# Patient Record
Sex: Female | Born: 1960 | Race: Black or African American | Hispanic: No | Marital: Single | Smoking: Current every day smoker
Health system: Southern US, Community
[De-identification: ages and names within clinical notes are randomized; demographics above are authoritative.]

## PROBLEM LIST (undated history)

## (undated) DIAGNOSIS — M549 Dorsalgia, unspecified: Secondary | ICD-10-CM

## (undated) DIAGNOSIS — G8929 Other chronic pain: Secondary | ICD-10-CM

## (undated) DIAGNOSIS — J449 Chronic obstructive pulmonary disease, unspecified: Secondary | ICD-10-CM

## (undated) DIAGNOSIS — J45909 Unspecified asthma, uncomplicated: Secondary | ICD-10-CM

## (undated) HISTORY — PX: ABDOMINAL HYSTERECTOMY: SHX81

---

## 2004-03-07 ENCOUNTER — Emergency Department: Payer: Self-pay | Admitting: Emergency Medicine

## 2005-06-18 ENCOUNTER — Emergency Department: Payer: Self-pay | Admitting: Emergency Medicine

## 2005-06-18 ENCOUNTER — Other Ambulatory Visit: Payer: Self-pay

## 2005-12-06 ENCOUNTER — Ambulatory Visit: Payer: Self-pay | Admitting: *Deleted

## 2005-12-18 ENCOUNTER — Ambulatory Visit: Payer: Self-pay | Admitting: *Deleted

## 2006-10-02 ENCOUNTER — Ambulatory Visit: Payer: Self-pay | Admitting: Podiatry

## 2006-10-17 ENCOUNTER — Ambulatory Visit: Payer: Self-pay | Admitting: *Deleted

## 2006-11-27 ENCOUNTER — Emergency Department: Payer: Self-pay | Admitting: Emergency Medicine

## 2006-12-08 ENCOUNTER — Ambulatory Visit: Payer: Self-pay | Admitting: Podiatry

## 2006-12-12 ENCOUNTER — Ambulatory Visit: Payer: Self-pay | Admitting: Podiatry

## 2007-01-06 ENCOUNTER — Emergency Department: Payer: Self-pay | Admitting: Emergency Medicine

## 2007-01-06 ENCOUNTER — Other Ambulatory Visit: Payer: Self-pay

## 2007-07-22 ENCOUNTER — Emergency Department: Payer: Self-pay | Admitting: Emergency Medicine

## 2007-07-22 ENCOUNTER — Other Ambulatory Visit: Payer: Self-pay

## 2008-01-14 ENCOUNTER — Emergency Department: Payer: Self-pay | Admitting: Emergency Medicine

## 2008-02-14 ENCOUNTER — Emergency Department: Payer: Self-pay | Admitting: Emergency Medicine

## 2009-04-18 ENCOUNTER — Ambulatory Visit: Payer: Self-pay | Admitting: Family Medicine

## 2009-05-12 ENCOUNTER — Ambulatory Visit: Payer: Self-pay | Admitting: Family Medicine

## 2009-08-28 ENCOUNTER — Emergency Department: Payer: Self-pay | Admitting: Emergency Medicine

## 2010-01-25 ENCOUNTER — Emergency Department: Payer: Self-pay | Admitting: Internal Medicine

## 2010-05-25 ENCOUNTER — Emergency Department: Payer: Self-pay | Admitting: Unknown Physician Specialty

## 2010-12-12 ENCOUNTER — Inpatient Hospital Stay: Payer: Self-pay | Admitting: Specialist

## 2011-10-30 ENCOUNTER — Emergency Department: Payer: Self-pay | Admitting: Emergency Medicine

## 2011-10-30 LAB — COMPREHENSIVE METABOLIC PANEL
Albumin: 4.1 g/dL (ref 3.4–5.0)
Alkaline Phosphatase: 79 U/L (ref 50–136)
BUN: 11 mg/dL (ref 7–18)
Bilirubin,Total: 0.4 mg/dL (ref 0.2–1.0)
Chloride: 110 mmol/L — ABNORMAL HIGH (ref 98–107)
Co2: 27 mmol/L (ref 21–32)
Creatinine: 0.74 mg/dL (ref 0.60–1.30)
EGFR (African American): >60
EGFR (Non-African Amer.): >60
Glucose: 78 mg/dL (ref 65–99)
Osmolality: 281 (ref 275–301)
SGPT (ALT): 21 U/L (ref 12–78)
Sodium: 142 mmol/L (ref 136–145)

## 2011-10-30 LAB — CBC WITH DIFFERENTIAL/PLATELET
Basophil %: 1.4 %
Eosinophil #: 0.1 10*3/uL (ref 0.0–0.7)
Eosinophil %: 0.8 %
HGB: 16.2 g/dL — ABNORMAL HIGH (ref 12.0–16.0)
Lymphocyte #: 3.1 10*3/uL (ref 1.0–3.6)
Lymphocyte %: 35.6 %
MCH: 30.8 pg (ref 26.0–34.0)
Monocyte #: 0.7 x10 3/mm (ref 0.2–0.9)
Neutrophil %: 54.2 %
Platelet: 204 10*3/uL (ref 150–440)
RBC: 5.26 10*6/uL — ABNORMAL HIGH (ref 3.80–5.20)
RDW: 14.8 % — ABNORMAL HIGH (ref 11.5–14.5)

## 2011-10-30 LAB — URINALYSIS, COMPLETE
Bilirubin,UR: NEGATIVE
Protein: 30
RBC,UR: 13 /HPF (ref 0–5)
Squamous Epithelial: 39
WBC UR: 25 /HPF (ref 0–5)

## 2012-03-24 ENCOUNTER — Emergency Department: Payer: Self-pay | Admitting: Emergency Medicine

## 2012-03-24 LAB — URINALYSIS, COMPLETE
Bilirubin,UR: NEGATIVE
Nitrite: NEGATIVE
Ph: 6 (ref 4.5–8.0)
RBC,UR: 3 /HPF (ref 0–5)
Specific Gravity: 1.014 (ref 1.003–1.030)
Squamous Epithelial: 12

## 2012-09-08 ENCOUNTER — Emergency Department: Payer: Self-pay | Admitting: Emergency Medicine

## 2012-09-08 LAB — URINALYSIS, COMPLETE
Bacteria: NONE SEEN
Blood: NEGATIVE
Glucose,UR: NEGATIVE mg/dL (ref 0–75)
Ketone: NEGATIVE
Nitrite: NEGATIVE
Ph: 7 (ref 4.5–8.0)
RBC,UR: 4 /HPF (ref 0–5)
Specific Gravity: 1.015 (ref 1.003–1.030)
Squamous Epithelial: 10
WBC UR: 17 /HPF (ref 0–5)

## 2012-09-08 LAB — COMPREHENSIVE METABOLIC PANEL
Bilirubin,Total: 0.4 mg/dL (ref 0.2–1.0)
Calcium, Total: 9.2 mg/dL (ref 8.5–10.1)
Creatinine: 0.74 mg/dL (ref 0.60–1.30)
Glucose: 102 mg/dL — ABNORMAL HIGH (ref 65–99)
Potassium: 3.8 mmol/L (ref 3.5–5.1)
SGOT(AST): 18 U/L (ref 15–37)
SGPT (ALT): 20 U/L (ref 12–78)
Total Protein: 7.4 g/dL (ref 6.4–8.2)

## 2012-09-08 LAB — CBC
HGB: 15.8 g/dL (ref 12.0–16.0)
MCV: 92 fL (ref 80–100)
WBC: 10.2 10*3/uL (ref 3.6–11.0)

## 2012-09-08 LAB — LIPASE, BLOOD: Lipase: 225 U/L (ref 73–393)

## 2012-09-08 LAB — GC/CHLAMYDIA PROBE AMP

## 2013-04-27 ENCOUNTER — Emergency Department: Payer: Self-pay | Admitting: Emergency Medicine

## 2013-04-27 LAB — COMPREHENSIVE METABOLIC PANEL
ALBUMIN: 3.9 g/dL (ref 3.4–5.0)
ALK PHOS: 79 U/L
Anion Gap: 3 — ABNORMAL LOW (ref 7–16)
BILIRUBIN TOTAL: 0.4 mg/dL (ref 0.2–1.0)
BUN: 12 mg/dL (ref 7–18)
Calcium, Total: 9.3 mg/dL (ref 8.5–10.1)
Chloride: 110 mmol/L — ABNORMAL HIGH (ref 98–107)
Co2: 27 mmol/L (ref 21–32)
Creatinine: 0.56 mg/dL — ABNORMAL LOW (ref 0.60–1.30)
EGFR (Non-African Amer.): >60
Glucose: 83 mg/dL (ref 65–99)
Osmolality: 278 (ref 275–301)
Potassium: 3.9 mmol/L (ref 3.5–5.1)
SGOT(AST): 18 U/L (ref 15–37)
SGPT (ALT): 18 U/L (ref 12–78)
SODIUM: 140 mmol/L (ref 136–145)
Total Protein: 7.8 g/dL (ref 6.4–8.2)

## 2013-04-27 LAB — CBC WITH DIFFERENTIAL/PLATELET
Basophil #: 0.1 10*3/uL (ref 0.0–0.1)
Basophil %: 1.1 %
Eosinophil #: 0.1 10*3/uL (ref 0.0–0.7)
Eosinophil %: 0.8 %
HCT: 47.8 % — ABNORMAL HIGH (ref 35.0–47.0)
HGB: 16.3 g/dL — ABNORMAL HIGH (ref 12.0–16.0)
Lymphocyte #: 2.7 10*3/uL (ref 1.0–3.6)
Lymphocyte %: 32.5 %
MCH: 31.5 pg (ref 26.0–34.0)
MCHC: 34 g/dL (ref 32.0–36.0)
MCV: 93 fL (ref 80–100)
MONO ABS: 0.4 x10 3/mm (ref 0.2–0.9)
Monocyte %: 4.8 %
Neutrophil #: 5.1 10*3/uL (ref 1.4–6.5)
Neutrophil %: 60.8 %
Platelet: 191 10*3/uL (ref 150–440)
RBC: 5.17 10*6/uL (ref 3.80–5.20)
RDW: 14.3 % (ref 11.5–14.5)
WBC: 8.3 10*3/uL (ref 3.6–11.0)

## 2013-04-27 LAB — URINALYSIS, COMPLETE
BILIRUBIN, UR: NEGATIVE
Blood: NEGATIVE
GLUCOSE, UR: NEGATIVE mg/dL (ref 0–75)
Ketone: NEGATIVE
Leukocyte Esterase: NEGATIVE
NITRITE: NEGATIVE
PROTEIN: NEGATIVE
Ph: 6 (ref 4.5–8.0)
Specific Gravity: 1.014 (ref 1.003–1.030)
WBC UR: 1 /HPF (ref 0–5)

## 2013-04-27 LAB — LIPASE, BLOOD: Lipase: 235 U/L (ref 73–393)

## 2013-05-14 ENCOUNTER — Ambulatory Visit: Payer: Self-pay | Admitting: Family Medicine

## 2014-04-22 ENCOUNTER — Other Ambulatory Visit: Payer: Self-pay | Admitting: Nurse Practitioner

## 2014-04-22 DIAGNOSIS — R921 Mammographic calcification found on diagnostic imaging of breast: Secondary | ICD-10-CM

## 2014-05-01 NOTE — H&P (Signed)
PATIENT NAME:  Briana Allen, Briana Allen MR#:  [id number] DATE OF BIRTH:  January 04, 1961  DATE OF ADMISSION:  12/12/2010  PRIMARY CARE PHYSICIAN: Dr. Gavin Potters at [facility]  CHIEF COMPLAINT: Left pleuritic chest pain.   HISTORY OF PRESENT ILLNESS: A 54 year old female with history of schizophrenia. She also has history of chronic obstructive pulmonary disease, she smokes about a pack per day. She presented with two day history of pain in the left side. She is complaining of pain in the left ribs and in the back area mainly when she takes a deep breath. Also complaining of severe pain with coughing. She is complaining of shortness of breath. She is complaining of cough but denies any expectoration. Also complaining of fever and chills. In the Emergency Room she was noted to have a temperature of 101.6. She said she was feeling hot at home, she was having chills. She denies any nausea, vomiting. No dizziness or lightheadedness. She has no previous cardiac history. No abdominal pain. When she was brought in here she was on 4 liters nasal cannula, she was 95% on 4 liters nasal cannula but on room air she is actually 95%. Her initial work-up in the Emergency Room showed that she had a white count of 24,000 and the chest x-ray shows that she has a left lower lobe lingula infiltrate which explains her left pleuritic chest pain. She denies any recent sick contact in the family. She is also complaining of some chest tightness and wheezing. She got a dose of 750 mg of IV Levaquin in the Emergency Room, she got 125 mg of IV Solu-Medrol and she got DuoNebs three doses in the Emergency Room. I was asked to admit the patient because of left pleuritic chest pain secondary to left lower lobe and lingular pneumonia, leukocytosis and chronic obstructive pulmonary disease exacerbation and hypoxia. She denies any hemoptysis.  REVIEW OF SYSTEMS: She is complaining of fever, chills, also complaining of some weakness. No acute change in  vision. No headache. No dizziness. She is complaining of cough, pleuritic chest pain. She has history of chronic obstructive pulmonary disease and asthma. She denies any palpitations, syncope. Denies any nausea, vomiting, abdominal pain, gastrointestinal bleed. No dysuria. No frequency. No thyroid problems. No rash. No joint pains. No anemia. No easy bruisability. No bleeding from any site. No focal numbness or weakness. She has schizophrenia.   PAST MEDICAL HISTORY: 1. Schizophrenia. 2. Asthma. 3. Chronic obstructive pulmonary disease.   PAST SURGICAL HISTORY: She said she had hysterectomy and bilateral oophorectomy.   ALLERGIES: Ampicillin.   HOME MEDICATIONS:  1. Benztropine 1 mg twice a day.  2. Divalproex 500 mg in the morning and 1000 mg at bedtime. 3. Quetiapine 200 mg in the morning, 200 at noon and 600 mg at bedtime.  4. Quetiapine 50 mg twice a day.  5. Risperidone 8 mg in the morning and 4 mg at bedtime.  6. Trazodone 150 mg at bedtime.  7. She also uses albuterol MDI p.r.n.   SOCIAL HISTORY: She lives by herself. Her friend comes sometimes. She smokes about a pack per day. She was smoking 2 packs a day before. Occasional beer drinker. She smokes marijuana.   FAMILY HISTORY: She says her mother had throat cancer.   PHYSICAL EXAMINATION:  VITAL SIGNS: Her vitals when she presented to the Emergency Room include: Temperature 101.6, heart rate 110, respiratory rate 24, blood pressure 116/81, saturating 95% on 4 liters nasal cannula. Currently heart rate 118, blood pressure 107/83, she  is saturating 97% on 2 liters and 95% on room air.   GENERAL: This is a middle-aged [place] female, obese. She is in mild distress because of left pleuritic chest pain.   HEENT: Bilateral pupils are equal. Extraocular muscles are intact. No scleral icterus. No conjunctivitis. Oral mucosa is dry. No pallor.   NECK: No thyroid tenderness, enlargement or nodule. Neck is supple. No masses,  nontender. No adenopathy. No JVD. No carotid bruit.   CHEST: She has bilateral coarse breath sounds. She has bronchial breathing on the left side posteriorly. She has some crackles there also. Normal respiratory effort. Not using accessory muscles of respiration.   HEART: Heart sounds are regular, tachycardic. No murmur. Good peripheral pulses. No lower extremity edema.   ABDOMEN: Soft, nontender. Normal bowel sounds. No hepatomegaly. No bruit. No masses.   RECTAL: Deferred.   NEUROLOGIC: She is awake, alert, oriented to time, place, and person. Poor insight. Cranial nerves are intact. Moving all extremities against gravity.   SKIN: No rash. No lesions. Skin appears to be dry.   LABORATORY, DIAGNOSTIC AND RADIOLOGICAL DATA: White count 24.4, hemoglobin 14.1, platelet count 152,000. BMP: Sodium 136, potassium 3.3, BUN 11, creatinine 1.11. Troponin negative. BNP 837. ABG shows pH 7.48, pCO2 31, pO2 64 on room air. Chest x-ray shows that she has left lower lobe and lingular pneumonia. EKG shows sinus tachycardia at 125, normal axis. She has some T wave inversions in lateral leads.   IMPRESSION:  1. Left lower lobe and lingula pneumonia.  2. Pleuritic chest pain secondary to pneumonia.  3. Chronic obstructive pulmonary disease exacerbation. 4. Systemic inflammatory response syndrome as evidenced by leukocytosis, tachycardia and fever secondary to pneumonia. 5. Schizophrenia, stable. 6. Tobacco abuse.   PLAN: A 54 year old female, she smokes about a pack per day, also history of schizophrenia. She presents with two day history of left pleuritic chest pain, coughing, chest tightness. She has left lower lobe lingula pneumonia that explains her left pleuritic chest pain. She has symptoms of systemic inflammatory response syndrome with white count of 24,000, high-grade fever, tachycardic. Blood cultures have been sent. Will send sputum culture. Will give her IV hydration. She has also been started on  Levaquin, will continue Levaquin on her. Will also give her IV Solu-Medrol. Will give her DuoNebs, Mucinex. I advised smoking cessation. Her EKG showed some nonspecific T wave changes. Will get serial cardiac enzymes on her. Monitor her on off unit telemetry but chest pain does not look to be cardiac, it looks to be pleuritic secondary to pneumonia. Will continue her psych medications.   TIME SPENT WITH ADMISSION AND COORDINATION: 45 minutes.    ____________________________ Fredia SorrowAbhinav Annette Bertelson, MD ag:cms D: 12/12/2010 19:35:57 ET T: 12/13/2010 05:48:52 ET JOB#: [id number]  cc: Fredia SorrowAbhinav Amonte Brookover, MD, <Dictator> Letitia CaulHeidi M. Grandis, MD Fredia SorrowABHINAV Cimone Fahey MD ELECTRONICALLY SIGNED 01/12/2011 17:12

## 2014-05-11 ENCOUNTER — Inpatient Hospital Stay
Admission: RE | Admit: 2014-05-11 | Discharge: 2014-05-11 | Disposition: A | Discharge status: Home / Self Care | Payer: Self-pay | Source: Ambulatory Visit | Attending: Nurse Practitioner | Admitting: Nurse Practitioner

## 2015-05-29 ENCOUNTER — Emergency Department
Admission: EM | Admit: 2015-05-29 | Discharge: 2015-05-29 | Disposition: A | Discharge status: Home / Self Care | Payer: Medicaid Other | Attending: Emergency Medicine | Admitting: Emergency Medicine

## 2015-05-29 ENCOUNTER — Encounter: Payer: Self-pay | Admitting: Emergency Medicine

## 2015-05-29 DIAGNOSIS — M549 Dorsalgia, unspecified: Secondary | ICD-10-CM | POA: Diagnosis not present

## 2015-05-29 DIAGNOSIS — R1030 Lower abdominal pain, unspecified: Secondary | ICD-10-CM

## 2015-05-29 DIAGNOSIS — F17200 Nicotine dependence, unspecified, uncomplicated: Secondary | ICD-10-CM | POA: Insufficient documentation

## 2015-05-29 DIAGNOSIS — J45909 Unspecified asthma, uncomplicated: Secondary | ICD-10-CM | POA: Diagnosis not present

## 2015-05-29 DIAGNOSIS — Z760 Encounter for issue of repeat prescription: Secondary | ICD-10-CM | POA: Diagnosis not present

## 2015-05-29 DIAGNOSIS — J449 Chronic obstructive pulmonary disease, unspecified: Secondary | ICD-10-CM | POA: Insufficient documentation

## 2015-05-29 DIAGNOSIS — G8929 Other chronic pain: Secondary | ICD-10-CM | POA: Diagnosis not present

## 2015-05-29 HISTORY — DX: Dorsalgia, unspecified: M54.9

## 2015-05-29 HISTORY — DX: Other chronic pain: G89.29

## 2015-05-29 HISTORY — DX: Unspecified asthma, uncomplicated: J45.909

## 2015-05-29 HISTORY — DX: Chronic obstructive pulmonary disease, unspecified: J44.9

## 2015-05-29 LAB — COMPREHENSIVE METABOLIC PANEL
ALBUMIN: 4.6 g/dL (ref 3.5–5.0)
ALT: 15 U/L (ref 14–54)
AST: 16 U/L (ref 15–41)
Alkaline Phosphatase: 68 U/L (ref 38–126)
Anion gap: 4 — ABNORMAL LOW (ref 5–15)
BILIRUBIN TOTAL: 0.5 mg/dL (ref 0.3–1.2)
BUN: 15 mg/dL (ref 6–20)
CHLORIDE: 106 mmol/L (ref 101–111)
CO2: 29 mmol/L (ref 22–32)
CREATININE: 0.62 mg/dL (ref 0.44–1.00)
Calcium: 9.1 mg/dL (ref 8.9–10.3)
GFR calc Af Amer: >60 mL/min (ref >60)
GLUCOSE: 88 mg/dL (ref 65–99)
Potassium: 4.3 mmol/L (ref 3.5–5.1)
Sodium: 139 mmol/L (ref 135–145)
Total Protein: 8.1 g/dL (ref 6.5–8.1)

## 2015-05-29 LAB — URINALYSIS COMPLETE WITH MICROSCOPIC (ARMC ONLY)
BACTERIA UA: NONE SEEN
Bilirubin Urine: NEGATIVE
GLUCOSE, UA: NEGATIVE mg/dL
HGB URINE DIPSTICK: NEGATIVE
KETONES UR: NEGATIVE mg/dL
LEUKOCYTES UA: NEGATIVE
NITRITE: NEGATIVE
Protein, ur: NEGATIVE mg/dL
SPECIFIC GRAVITY, URINE: 1.016 (ref 1.005–1.030)
pH: 5 (ref 5.0–8.0)

## 2015-05-29 LAB — CBC
HEMATOCRIT: 45.2 % (ref 35.0–47.0)
Hemoglobin: 15 g/dL (ref 12.0–16.0)
MCH: 30.3 pg (ref 26.0–34.0)
MCHC: 33.1 g/dL (ref 32.0–36.0)
MCV: 91.8 fL (ref 80.0–100.0)
PLATELETS: 208 10*3/uL (ref 150–440)
RBC: 4.93 MIL/uL (ref 3.80–5.20)
RDW: 14.9 % — ABNORMAL HIGH (ref 11.5–14.5)
WBC: 7.8 10*3/uL (ref 3.6–11.0)

## 2015-05-29 MED ORDER — DIAZEPAM 5 MG PO TABS: 5.0000 mg | ORAL_TABLET | Freq: Three times a day (TID) | ORAL | Status: AC | PRN

## 2015-05-29 MED ORDER — OXYCODONE-ACETAMINOPHEN 5-325 MG PO TABS: 1.0000 | ORAL_TABLET | Freq: Four times a day (QID) | ORAL | Status: DC | PRN

## 2015-05-29 MED ORDER — DIAZEPAM 5 MG PO TABS
5.0000 mg | ORAL_TABLET | Freq: Once | ORAL | Status: AC
  Administered 2015-05-29: 5 mg via ORAL
  Filled 2015-05-29: qty 1

## 2015-05-29 MED ORDER — OXYCODONE-ACETAMINOPHEN 5-325 MG PO TABS
1.0000 | ORAL_TABLET | Freq: Once | ORAL | Status: AC
  Administered 2015-05-29: 1 via ORAL
  Filled 2015-05-29: qty 1

## 2015-05-29 NOTE — ED Notes (Signed)
Says lower abdominal pain.  Also says she is out of meds for chronic back pain.

## 2015-05-29 NOTE — Discharge Instructions (Signed)
Abdominal Pain, Adult °Many things can cause abdominal pain. Usually, abdominal pain is not caused by a disease and will improve without treatment. It can often be observed and treated at home. Your health care provider will do a physical exam and possibly order blood tests and X-rays to help determine the seriousness of your pain. However, in many cases, more time must pass before a clear cause of the pain can be found. Before that point, your health care provider may not know if you need more testing or further treatment. °HOME CARE INSTRUCTIONS °Monitor your abdominal pain for any changes. The following actions may help to alleviate any discomfort you are experiencing: °· Only take over-the-counter or prescription medicines as directed by your health care provider. °· Do not take laxatives unless directed to do so by your health care provider. °· Try a clear liquid diet (broth, tea, or water) as directed by your health care provider. Slowly move to a bland diet as tolerated. °SEEK MEDICAL CARE IF: °· You have unexplained abdominal pain. °· You have abdominal pain associated with nausea or diarrhea. °· You have pain when you urinate or have a bowel movement. °· You experience abdominal pain that wakes you in the night. °· You have abdominal pain that is worsened or improved by eating food. °· You have abdominal pain that is worsened with eating fatty foods. °· You have a fever. °SEEK IMMEDIATE MEDICAL CARE IF: °· Your pain does not go away within 2 hours. °· You keep throwing up (vomiting). °· Your pain is felt only in portions of the abdomen, such as the right side or the left lower portion of the abdomen. °· You pass bloody or black tarry stools. °MAKE SURE YOU: °· Understand these instructions. °· Will watch your condition. °· Will get help right away if you are not doing well or get worse. °  °This information is not intended to replace advice given to you by your health care provider. Make sure you discuss  any questions you have with your health care provider. °  °Document Released: 10/03/2004 Document Revised: 09/14/2014 Document Reviewed: 09/02/2012 °Elsevier Interactive Patient Education ©2016 Elsevier Inc. ° °Back Pain, Adult °Back pain is very common in adults. The cause of back pain is rarely dangerous and the pain often gets better over time. The cause of your back pain may not be known. Some common causes of back pain include: °· Strain of the muscles or ligaments supporting the spine. °· Wear and tear (degeneration) of the spinal disks. °· Arthritis. °· Direct injury to the back. °For many people, back pain may return. Since back pain is rarely dangerous, most people can learn to manage this condition on their own. °HOME CARE INSTRUCTIONS °Watch your back pain for any changes. The following actions may help to lessen any discomfort you are feeling: °· Remain active. It is stressful on your back to sit or stand in one place for long periods of time. Do not sit, drive, or stand in one place for more than 30 minutes at a time. Take short walks on even surfaces as soon as you are able. Try to increase the length of time you walk each day. °· Exercise regularly as directed by your health care provider. Exercise helps your back heal faster. It also helps avoid future injury by keeping your muscles strong and flexible. °· Do not stay in bed. Resting more than 1-2 days can delay your recovery. °· Pay attention to your body when you bend and lift.   The most comfortable positions are those that put less stress on your recovering back. Always use proper lifting techniques, including:  Bending your knees.  Keeping the load close to your body.  Avoiding twisting.  Find a comfortable position to sleep. Use a firm mattress and lie on your side with your knees slightly bent. If you lie on your back, put a pillow under your knees.  Avoid feeling anxious or stressed.Stress increases muscle tension and can worsen back  pain.It is important to recognize when you are anxious or stressed and learn ways to manage it, such as with exercise.  Take medicines only as directed by your health care provider. Over-the-counter medicines to reduce pain and inflammation are often the most helpful.Your health care provider may prescribe muscle relaxant drugs.These medicines help dull your pain so you can more quickly return to your normal activities and healthy exercise.  Apply ice to the injured area:  Put ice in a plastic bag.  Place a towel between your skin and the bag.  Leave the ice on for 20 minutes, 2-3 times a day for the first 2-3 days. After that, ice and heat may be alternated to reduce pain and spasms.  Maintain a healthy weight. Excess weight puts extra stress on your back and makes it difficult to maintain good posture. SEEK MEDICAL CARE IF:  You have pain that is not relieved with rest or medicine.  You have increasing pain going down into the legs or buttocks.  You have pain that does not improve in one week.  You have night pain.  You lose weight.  You have a fever or chills. SEEK IMMEDIATE MEDICAL CARE IF:   You develop new bowel or bladder control problems.  You have unusual weakness or numbness in your arms or legs.  You develop nausea or vomiting.  You develop abdominal pain.  You feel faint.   This information is not intended to replace advice given to you by your health care provider. Make sure you discuss any questions you have with your health care provider.   Document Released: 12/24/2004 Document Revised: 01/14/2014 Document Reviewed: 04/27/2013 Elsevier Interactive Patient Education 2016 Elsevier Inc.  Chronic Pain Chronic pain can be defined as pain that is off and on and lasts for 3-6 months or longer. Many things cause chronic pain, which can make it difficult to make a diagnosis. There are many treatment options available for chronic pain. However, finding a  treatment that works well for you may require trying various approaches until the right one is found. Many people benefit from a combination of two or more types of treatment to control their pain. SYMPTOMS  Chronic pain can occur anywhere in the body and can range from mild to very severe. Some types of chronic pain include:  Headache.  Low back pain.  Cancer pain.  Arthritis pain.  Neurogenic pain. This is pain resulting from damage to nerves. People with chronic pain may also have other symptoms such as:  Depression.  Anger.  Insomnia.  Anxiety. DIAGNOSIS  Your health care provider will help diagnose your condition over time. In many cases, the initial focus will be on excluding possible conditions that could be causing the pain. Depending on your symptoms, your health care provider may order tests to diagnose your condition. Some of these tests may include:   Blood tests.   CT scan.   MRI.   X-rays.   Ultrasounds.   Nerve conduction studies.  You may need  to see a specialist.  TREATMENT  Finding treatment that works well may take time. You may be referred to a pain specialist. He or she may prescribe medicine or therapies, such as:   Mindful meditation or yoga.  Shots (injections) of numbing or pain-relieving medicines into the spine or area of pain.  Local electrical stimulation.  Acupuncture.   Massage therapy.   Aroma, color, light, or sound therapy.   Biofeedback.   Working with a physical therapist to keep from getting stiff.   Regular, gentle exercise.   Cognitive or behavioral therapy.   Group support.  Sometimes, surgery may be recommended.  HOME CARE INSTRUCTIONS   Take all medicines as directed by your health care provider.   Lessen stress in your life by relaxing and doing things such as listening to calming music.   Exercise or be active as directed by your health care provider.   Eat a healthy diet and include  things such as vegetables, fruits, fish, and lean meats in your diet.   Keep all follow-up appointments with your health care provider.   Attend a support group with others suffering from chronic pain. SEEK MEDICAL CARE IF:   Your pain gets worse.   You develop a new pain that was not there before.   You cannot tolerate medicines given to you by your health care provider.   You have new symptoms since your last visit with your health care provider.  SEEK IMMEDIATE MEDICAL CARE IF:   You feel weak.   You have decreased sensation or numbness.   You lose control of bowel or bladder function.   Your pain suddenly gets much worse.   You develop shaking.  You develop chills.  You develop confusion.  You develop chest pain.  You develop shortness of breath.  MAKE SURE YOU:  Understand these instructions.  Will watch your condition.  Will get help right away if you are not doing well or get worse.   This information is not intended to replace advice given to you by your health care provider. Make sure you discuss any questions you have with your health care provider.   Document Released: 09/15/2001 Document Revised: 08/26/2012 Document Reviewed: 06/19/2012 Elsevier Interactive Patient Education [employer]

## 2015-05-29 NOTE — ED Provider Notes (Signed)
[facility] Emergency Department Provider Note  ____________________________________________  Time seen: 2:50 PM  I have reviewed the triage vital signs and the nursing notes.   HISTORY  Chief Complaint Abdominal Pain and Back Pain    HPI Briana Allen is a 55 y.o. female who complains of worsening of her chronic back pain over the last month. She's been out of her chronic OxyContin and muscle relaxer for the past month due to recently moving back to [place] from [place]. Denies any new injuries. Denies any numbness tingling or weakness in the legs. No bowel or bladder bladder incontinence or retention. She does report that she's having suprapubic pain over the last 2 weeks which is worse in the morning and hurts until she goes to the bathroom to PE. She has no trouble passing her urine. He has no urgency or frequency.Urination seems to relieve the abdominal pain Patient indicates the area of low back pain as the bilateral lumbar back.    Past Medical History  Diagnosis Date  . Chronic back pain   . COPD (chronic obstructive pulmonary disease) (HCC)   . Asthma      There are no active problems to display for this patient.    No past surgical history on file.   Current Outpatient Rx  Name  Route  Sig  Dispense  Refill  . diazepam (VALIUM) 5 MG tablet   Oral   Take 1 tablet (5 mg total) by mouth every 8 (eight) hours as needed for muscle spasms.   6 tablet   0   . oxyCODONE-acetaminophen (ROXICET) 5-325 MG tablet   Oral   Take 1 tablet by mouth every 6 (six) hours as needed for severe pain.   10 tablet   0      Allergies Ampicillin   No family history on file.  Social History Social History  Substance Use Topics  . Smoking status: Current Every Day Smoker  . Smokeless tobacco: None  . Alcohol Use: No    Review of Systems  Constitutional:   No fever or chills.  Eyes:   No vision changes.  ENT:   No sore throat. No  rhinorrhea. Cardiovascular:   No chest pain. Respiratory:   No dyspnea or cough. Gastrointestinal:   Suprapubic abdominal pain, no vomiting or diarrhea.  Genitourinary:   Negative for dysuria or difficulty urinating. Musculoskeletal:   Chronic back pain Neurological:   Negative for headaches 10-point ROS otherwise negative.  ____________________________________________   PHYSICAL EXAM:  VITAL SIGNS: ED Triage Vitals  Enc Vitals Group     BP 05/29/15 1202 126/80 mmHg     Pulse Rate 05/29/15 1202 80     Resp 05/29/15 1202 16     Temp 05/29/15 1202 98.1 F (36.7 C)     Temp Source 05/29/15 1202 Oral     SpO2 05/29/15 1202 96 %     Weight 05/29/15 1202 244 lb (110.678 kg)     Height 05/29/15 1202  (1.676 m)     Head Cir --      Peak Flow --      Pain Score 05/29/15 1203 10     Pain Loc --      Pain Edu? --      Excl. in GC? --     Vital signs reviewed, nursing assessments reviewed.   Constitutional:   Alert and oriented. Well appearing and in no distress.Morbidly obese Eyes:   No scleral icterus. No conjunctival  pallor. PERRL. EOMI.  No nystagmus. ENT   Head:   Normocephalic and atraumatic.   Nose:   No congestion/rhinnorhea. No septal hematoma   Mouth/Throat:   MMM, no pharyngeal erythema. No peritonsillar mass.    Neck:   No stridor. No SubQ emphysema. No meningismus. Hematological/Lymphatic/Immunilogical:   No cervical lymphadenopathy. Cardiovascular:   RRR. Symmetric bilateral radial and DP pulses.  No murmurs.  Respiratory:   Normal respiratory effort without tachypnea nor retractions. Breath sounds are clear and equal bilaterally. No wheezes/rales/rhonchi. Gastrointestinal:   Soft and nontender. Non distended. There is no CVA tenderness.  No rebound, rigidity, or guarding. Genitourinary:   deferred Musculoskeletal:   Nontender with normal range of motion in all extremities. No joint effusions.  No lower extremity tenderness.  No edema.No midline  spinal tenderness. No muscular tenderness in the lower back. Neurologic:   Normal speech and language.  CN 2-10 normal. Motor grossly intact. No gross focal neurologic deficits are appreciated.  Skin:    Skin is warm, dry and intact. No rash noted.  No petechiae, purpura, or bullae.  ____________________________________________    LABS (pertinent positives/negatives) (all labs ordered are listed, but only abnormal results are displayed) Labs Reviewed  URINALYSIS COMPLETEWITH MICROSCOPIC ([facility] ONLY) - Abnormal; Notable for the following:    Color, Urine YELLOW (*)    APPearance CLEAR (*)    Squamous Epithelial / LPF 0-5 (*)    All other components within normal limits  CBC - Abnormal; Notable for the following:    RDW 14.9 (*)    All other components within normal limits  COMPREHENSIVE METABOLIC PANEL - Abnormal; Notable for the following:    Anion gap 4 (*)    All other components within normal limits   ____________________________________________   EKG    ____________________________________________    RADIOLOGY    ____________________________________________   PROCEDURES   ____________________________________________   INITIAL IMPRESSION / ASSESSMENT AND PLAN / ED COURSE  Pertinent labs & imaging results that were available during my care of the patient were reviewed by me and considered in my medical decision making (see chart for details).  Patient well appearing no acute distress. Labs unremarkable, no evidence of urinary tract infection. She denies any abnormal vaginal discharge or bleeding and is low suspicion for sexually transmitted infection and denies any perineal or genital lesions. We'll discharge home to follow up with primary care. She is getting herself established with [facility]. I'll provide a limited prescription of oxycodone and Valium which should help with her muscle relaxation as well as chronic anxiety. I checked the drug database and  there were no entries in the last 6 months for this patient in [place].Considering the patient's symptoms, medical history, and physical examination today, I have low suspicion for cholecystitis or biliary pathology, pancreatitis, perforation or bowel obstruction, hernia, intra-abdominal abscess, AAA or dissection, volvulus or intussusception, mesenteric ischemia, or appendicitis.       ____________________________________________   FINAL CLINICAL IMPRESSION(S) / ED DIAGNOSES  Final diagnoses:  Chronic back pain  Suprapubic pain, unspecified laterality  Medication refill       Portions of this note were generated with dragon dictation software. Dictation errors may occur despite best attempts at proofreading.   Sharman CheekPhillip Darnell Stimson, MD 05/29/15 [phone]

## 2015-08-24 ENCOUNTER — Emergency Department
Admission: EM | Admit: 2015-08-24 | Discharge: 2015-08-24 | Disposition: A | Discharge status: Home / Self Care | Payer: Medicaid Other | Attending: Emergency Medicine | Admitting: Emergency Medicine

## 2015-08-24 ENCOUNTER — Encounter: Payer: Self-pay | Admitting: Emergency Medicine

## 2015-08-24 ENCOUNTER — Emergency Department: Payer: Medicaid Other

## 2015-08-24 DIAGNOSIS — M19011 Primary osteoarthritis, right shoulder: Secondary | ICD-10-CM | POA: Diagnosis not present

## 2015-08-24 DIAGNOSIS — J449 Chronic obstructive pulmonary disease, unspecified: Secondary | ICD-10-CM | POA: Insufficient documentation

## 2015-08-24 DIAGNOSIS — J45909 Unspecified asthma, uncomplicated: Secondary | ICD-10-CM | POA: Insufficient documentation

## 2015-08-24 DIAGNOSIS — M25511 Pain in right shoulder: Secondary | ICD-10-CM | POA: Diagnosis present

## 2015-08-24 DIAGNOSIS — F17200 Nicotine dependence, unspecified, uncomplicated: Secondary | ICD-10-CM | POA: Diagnosis not present

## 2015-08-24 MED ORDER — MELOXICAM 15 MG PO TABS
15.0000 mg | ORAL_TABLET | Freq: Every day | ORAL | 0 refills | Status: DC
Start: 2015-08-24 — End: 2017-11-23

## 2015-08-24 MED ORDER — KETOROLAC TROMETHAMINE 30 MG/ML IJ SOLN
INTRAMUSCULAR | Status: AC
  Filled 2015-08-24: qty 1

## 2015-08-24 MED ORDER — OXYCODONE-ACETAMINOPHEN 5-325 MG PO TABS
ORAL_TABLET | ORAL | Status: AC
  Administered 2015-08-24: 1 via ORAL
  Filled 2015-08-24: qty 1

## 2015-08-24 MED ORDER — OXYCODONE-ACETAMINOPHEN 5-325 MG PO TABS
1.0000 | ORAL_TABLET | Freq: Once | ORAL | Status: AC
  Administered 2015-08-24: 1 via ORAL

## 2015-08-24 NOTE — ED Triage Notes (Addendum)
Pt c/o right shoulder pain. Unable to raise right shoulder pain. Worse with movement. Pt flinches and jumps in pain when RN touches shoulder. Denies injury. Sx X 4 days. Pt denies hx other than asthma copd. Denies SHOB, nause, sweating or CP

## 2015-08-24 NOTE — ED Provider Notes (Signed)
[facility] Emergency Department Provider Note  ____________________________________________  Time seen: Approximately 5:46 PM  I have reviewed the triage vital signs and the nursing notes.   HISTORY  Chief Complaint Shoulder Pain    HPI Briana Allen is a 55 y.o. female who presents emergency department complaining of right shoulder pain. Patient states that she has not experienced an injury precipitating this pain. Patient reports chronic back pain but states that she does not typically have right shoulder pain. She denies any injury to cervical spine or her right arm. She denies any numbness or tingling and right arm. She reports a "grinding sensation" to the shoulder with movement. Pain is constant, worse with movement, sharp, severe.    Past Medical History:  Diagnosis Date  . Asthma   . Chronic back pain   . COPD (chronic obstructive pulmonary disease) (HCC)     There are no active problems to display for this patient.   History reviewed. No pertinent surgical history.  Prior to Admission medications   Medication Sig Start Date End Date Taking? Authorizing Provider  diazepam (VALIUM) 5 MG tablet Take 1 tablet (5 mg total) by mouth every 8 (eight) hours as needed for muscle spasms. 05/29/15   Sharman CheekPhillip Stafford, MD  meloxicam (MOBIC) 15 MG tablet Take 1 tablet (15 mg total) by mouth daily. 08/24/15   Delorise RoyalsJonathan D Winner Valeriano, PA-C  oxyCODONE-acetaminophen (ROXICET) 5-325 MG tablet Take 1 tablet by mouth every 6 (six) hours as needed for severe pain. 05/29/15   Sharman CheekPhillip Stafford, MD    Allergies Ampicillin  History reviewed. No pertinent family history.  Social History Social History  Substance Use Topics  . Smoking status: Current Every Day Smoker  . Smokeless tobacco: Not on file  . Alcohol use No     Review of Systems  Constitutional: No fever/chills Cardiovascular: no chest pain. Respiratory: no cough. No SOB. Musculoskeletal:  Positive for right shoulder pain. Skin: Negative for rash, abrasions, lacerations, ecchymosis. Neurological: Negative for headaches, focal weakness or numbness. 10-point ROS otherwise negative.  ____________________________________________   PHYSICAL EXAM:  VITAL SIGNS: ED Triage Vitals  Enc Vitals Group     BP 08/24/15 1708 (!) 141/83     Pulse Rate 08/24/15 1708 95     Resp 08/24/15 1708 20     Temp 08/24/15 1708 98.5 F (36.9 C)     Temp Source 08/24/15 1708 Oral     SpO2 08/24/15 1708 95 %     Weight 08/24/15 1705 248 lb (112.5 kg)     Height 08/24/15 1705 5\' 6"  (1.676 m)     Head Circumference --      Peak Flow --      Pain Score 08/24/15 1706 10     Pain Loc --      Pain Edu? --      Excl. in GC? --      Constitutional: Alert and oriented. Well appearing and in no acute distress. Eyes: Conjunctivae are normal. PERRL. EOMI. Head: Atraumatic. Neck: No stridor.  No cervical spine tenderness to palpation.  Cardiovascular: Normal rate, regular rhythm. Normal S1 and S2.  Good peripheral circulation. Respiratory: Normal respiratory effort without tachypnea or retractions. Lungs CTAB. Good air entry to the bases with no decreased or absent breath sounds. Musculoskeletal: Full range of motion to all extremities. No gross deformities appreciated.No deformities noted to right shoulder. Limited range of motion due to pain. Patient endorses tenderness to palpation over the entirety of the right  shoulder including clavicle, scapula, proximal humerus, and musculature. Patient does not report any specific point tenderness being worse than other areas of palpation. Patient would not comply with special test due to pain. Radial pulse intact. Sensation intact distally. Neurologic:  Normal speech and language. No gross focal neurologic deficits are appreciated.  Skin:  Skin is warm, dry and intact. No rash noted. Psychiatric: Mood and affect are normal. Speech and behavior are normal.  Patient exhibits appropriate insight and judgement.   ____________________________________________   LABS (all labs ordered are listed, but only abnormal results are displayed)  Labs Reviewed - No data to display ____________________________________________  EKG  EKG reveals normal sinus rhythm at a rate of 93 bpm. No ST elevations or depressions noted. Inverted P-wave in V1 is consistent with left atrial enlargement. PR, QRS, QT intervals within normal limits. No Q waves are delta waves identified. ____________________________________________  RADIOLOGY Festus BarrenI, Leonides Minder D Markese Bloxham, personally viewed and evaluated these images (plain radiographs) as part of my medical decision making, as well as reviewing the written report by the radiologist.  Dg Shoulder Right  Result Date: 08/24/2015 CLINICAL DATA:  Initial evaluation for acute shoulder pain. EXAM: RIGHT SHOULDER - 2+ VIEW COMPARISON:  None. FINDINGS: Humeral head in normal alignment with the glenoid. No acute fracture dislocation. AC joint approximated. The degenerative osteoarthritic changes present at the [facility] joint. No periarticular calcification. No acute soft tissue abnormality. Visualize right hemi thorax is clear. IMPRESSION: 1. No acute fracture or dislocation. 2. Degenerative osteoarthrosis about the right AC joint. Electronically Signed   By: Rise MuBenjamin  McClintock M.D.   On: 08/24/2015 18:55    ____________________________________________    PROCEDURES  Procedure(s) performed:    Procedures    Medications  oxyCODONE-acetaminophen (PERCOCET/ROXICET) 5-325 MG per tablet 1 tablet (1 tablet Oral Given 08/24/15 2006)     ____________________________________________   INITIAL IMPRESSION / ASSESSMENT AND PLAN / ED COURSE  Pertinent labs & imaging results that were available during my care of the patient were reviewed by me and considered in my medical decision making (see chart for details).  Clinical Course     Patient's diagnosis is consistent with Osteoarthritis of the right shoulder. X-ray reveals no acute osseous abnormality. Patient is on chronic pain medications but states [place] and has not established with a pain management specialist at this time. Patient requested pain medication but was informed that she would have to establish with a primary care or pain management to receive more narcotics.. Patient will be discharged home with prescriptions for anti-inflammatories for symptom control. Patient is to follow up with orthopedics as needed or otherwise directed. Patient is given ED precautions to return to the ED for any worsening or new symptoms.     ____________________________________________  FINAL CLINICAL IMPRESSION(S) / ED DIAGNOSES  Final diagnoses:  Primary osteoarthritis of right shoulder      NEW MEDICATIONS STARTED DURING THIS VISIT:  New Prescriptions   MELOXICAM (MOBIC) 15 MG TABLET    Take 1 tablet (15 mg total) by mouth daily.        This chart was dictated using voice recognition software/Dragon. Despite best efforts to proofread, errors can occur which can change the meaning. Any change was purely unintentional.    Racheal PatchesJonathan D Gaelan Glennon, PA-C 08/24/15 2011    Myrna Blazeravid Matthew Schaevitz, MD 08/24/15 2020

## 2016-07-16 ENCOUNTER — Encounter: Payer: Self-pay | Admitting: Emergency Medicine

## 2016-07-16 ENCOUNTER — Emergency Department
Admission: EM | Admit: 2016-07-16 | Discharge: 2016-07-16 | Disposition: A | Discharge status: Home / Self Care | Payer: Medicaid Other | Attending: Emergency Medicine | Admitting: Emergency Medicine

## 2016-07-16 ENCOUNTER — Emergency Department: Payer: Medicaid Other

## 2016-07-16 DIAGNOSIS — R079 Chest pain, unspecified: Secondary | ICD-10-CM | POA: Diagnosis present

## 2016-07-16 DIAGNOSIS — F17210 Nicotine dependence, cigarettes, uncomplicated: Secondary | ICD-10-CM | POA: Insufficient documentation

## 2016-07-16 DIAGNOSIS — J441 Chronic obstructive pulmonary disease with (acute) exacerbation: Secondary | ICD-10-CM | POA: Diagnosis not present

## 2016-07-16 DIAGNOSIS — J45909 Unspecified asthma, uncomplicated: Secondary | ICD-10-CM | POA: Diagnosis not present

## 2016-07-16 DIAGNOSIS — R109 Unspecified abdominal pain: Secondary | ICD-10-CM | POA: Diagnosis not present

## 2016-07-16 DIAGNOSIS — Z791 Long term (current) use of non-steroidal anti-inflammatories (NSAID): Secondary | ICD-10-CM | POA: Insufficient documentation

## 2016-07-16 DIAGNOSIS — G8929 Other chronic pain: Secondary | ICD-10-CM | POA: Diagnosis not present

## 2016-07-16 LAB — BASIC METABOLIC PANEL
Anion gap: 7 (ref 5–15)
BUN: 13 mg/dL (ref 6–20)
CHLORIDE: 106 mmol/L (ref 101–111)
CO2: 26 mmol/L (ref 22–32)
CREATININE: 0.67 mg/dL (ref 0.44–1.00)
Calcium: 9 mg/dL (ref 8.9–10.3)
GFR calc Af Amer: >60 mL/min (ref >60)
GFR calc non Af Amer: >60 mL/min (ref >60)
GLUCOSE: 92 mg/dL (ref 65–99)
Potassium: 3.7 mmol/L (ref 3.5–5.1)
SODIUM: 139 mmol/L (ref 135–145)

## 2016-07-16 LAB — CBC
HEMATOCRIT: 46.4 % (ref 35.0–47.0)
Hemoglobin: 15.6 g/dL (ref 12.0–16.0)
MCH: 30 pg (ref 26.0–34.0)
MCHC: 33.6 g/dL (ref 32.0–36.0)
MCV: 89.3 fL (ref 80.0–100.0)
PLATELETS: 202 10*3/uL (ref 150–440)
RBC: 5.2 MIL/uL (ref 3.80–5.20)
RDW: 15.1 % — AB (ref 11.5–14.5)
WBC: 9.2 10*3/uL (ref 3.6–11.0)

## 2016-07-16 LAB — LIPASE, BLOOD: LIPASE: 29 U/L (ref 11–51)

## 2016-07-16 LAB — HEPATIC FUNCTION PANEL
ALBUMIN: 4 g/dL (ref 3.5–5.0)
ALK PHOS: 65 U/L (ref 38–126)
ALT: 14 U/L (ref 14–54)
AST: 18 U/L (ref 15–41)
BILIRUBIN TOTAL: 0.7 mg/dL (ref 0.3–1.2)
Bilirubin, Direct: <0.1 mg/dL — ABNORMAL LOW (ref 0.1–0.5)
Total Protein: 7.2 g/dL (ref 6.5–8.1)

## 2016-07-16 LAB — TROPONIN I
Troponin I: <0.03 ng/mL (ref <0.03)
Troponin I: <0.03 ng/mL (ref <0.03)

## 2016-07-16 MED ORDER — METHYLPREDNISOLONE SODIUM SUCC 125 MG IJ SOLR
125.0000 mg | Freq: Once | INTRAMUSCULAR | Status: AC
  Administered 2016-07-16: 125 mg via INTRAVENOUS
  Filled 2016-07-16: qty 2

## 2016-07-16 MED ORDER — ONDANSETRON HCL 4 MG/2ML IJ SOLN
4.0000 mg | Freq: Once | INTRAMUSCULAR | Status: AC
  Administered 2016-07-16: 4 mg via INTRAVENOUS
  Filled 2016-07-16: qty 2

## 2016-07-16 MED ORDER — AZITHROMYCIN 250 MG PO TABS: ORAL_TABLET | ORAL | 0 refills | Status: AC

## 2016-07-16 MED ORDER — MORPHINE SULFATE (PF) 4 MG/ML IV SOLN
4.0000 mg | Freq: Once | INTRAVENOUS | Status: AC
  Administered 2016-07-16: 4 mg via INTRAVENOUS
  Filled 2016-07-16: qty 1

## 2016-07-16 MED ORDER — PREDNISONE 50 MG PO TABS: ORAL_TABLET | ORAL | 0 refills | Status: DC

## 2016-07-16 MED ORDER — IPRATROPIUM-ALBUTEROL 0.5-2.5 (3) MG/3ML IN SOLN
9.0000 mL | Freq: Once | RESPIRATORY_TRACT | Status: AC
  Administered 2016-07-16: 9 mL via RESPIRATORY_TRACT
  Filled 2016-07-16: qty 9

## 2016-07-16 NOTE — ED Triage Notes (Signed)
Pt presents to ED by EMS with c/o mid chest "heaviness" and lower back pain. Pt states her chest heaviness was present when she woke up this morning and her back pain is ongoing/chronic and she"takes medication for it". Pt reports a wet sounding cough for the past month as well. Pt states "the pollen got me. I was sick bad." pt currently has no increased work of breathing or distress noted.

## 2016-07-16 NOTE — ED Notes (Signed)
Pt given sprite. NAD

## 2016-07-16 NOTE — ED Notes (Signed)
Meal tray given to pt per edp. Pt with no acute distress noted.

## 2016-07-16 NOTE — ED Provider Notes (Signed)
[facility] Emergency Department Provider Note  ____________________________________________   First MD Initiated Contact with Patient 07/16/16 0700     (approximate)  I have reviewed the triage vital signs and the nursing notes.   HISTORY  Chief Complaint Chest Pain and Back Pain   HPI Briana Allen is a 56 y.o. female with a history of COPD as well as chronic back pain who is presenting to the emergency department today with chest pressure. She denies any pain to her chest and says that the sensation is only of pressure that is associated with coughing up yellow sputum. She says that this feels similar to an asthma exacerbation. She says that she has had worsening and ongoing difficulty with breathing over the past 2 months. She says that she is taking nebulizer treatments at home without relief. Denies any runny nose or throat pain. Does not report any sick contacts. Also whenever chronic bilateral low back pain and right hip pain. She states that this is unchanged but she needs medicine for pain control this time. Says the pain is moderate to severe and worse with movement. Denies any nausea or vomiting. Denies any radiation of the pain to the bilateral lower extremities.   Past Medical History:  Diagnosis Date  . Asthma   . Chronic back pain   . COPD (chronic obstructive pulmonary disease) (HCC)     There are no active problems to display for this patient.   Past Surgical History:  Procedure Laterality Date  . ABDOMINAL HYSTERECTOMY      Prior to Admission medications   Medication Sig Start Date End Date Taking? Authorizing Provider  diazepam (VALIUM) 5 MG tablet Take 1 tablet (5 mg total) by mouth every 8 (eight) hours as needed for muscle spasms. 05/29/15   Sharman Cheek, MD  meloxicam (MOBIC) 15 MG tablet Take 1 tablet (15 mg total) by mouth daily. 08/24/15   Cuthriell, Delorise Royals, PA-C  oxyCODONE-acetaminophen (ROXICET) 5-325 MG tablet  Take 1 tablet by mouth every 6 (six) hours as needed for severe pain. 05/29/15   Sharman Cheek, MD    Allergies Ampicillin  History reviewed. No pertinent family history.  Social History Social History  Substance Use Topics  . Smoking status: Current Every Day Smoker    Types: Cigarettes  . Smokeless tobacco: Never Used  . Alcohol use No    Review of Systems  Constitutional: No fever/chills Eyes: No visual changes. ENT: No sore throat. Cardiovascular: as above Respiratory: as above Gastrointestinal: No abdominal pain.  No nausea, no vomiting.  No diarrhea.  No constipation. Genitourinary: Negative for dysuria. Musculoskeletal: as above Skin: Negative for rash. Neurological: Negative for headaches, focal weakness or numbness.   ____________________________________________   PHYSICAL EXAM:  VITAL SIGNS: ED Triage Vitals  Enc Vitals Group     BP 07/16/16 0628 121/85     Pulse Rate 07/16/16 0628 (!) 57     Resp 07/16/16 0628 20     Temp 07/16/16 0628 98.8 F (37.1 C)     Temp Source 07/16/16 0627 Oral     SpO2 07/16/16 0628 98 %     Weight 07/16/16 0630 230 lb (104.3 kg)     Height 07/16/16 0630 5\' 6"  (1.676 m)     Head Circumference --      Peak Flow --      Pain Score 07/16/16 0627 8     Pain Loc --      Pain Edu? --  Excl. in GC? --     Constitutional: Alert and oriented. Patient rocking back and forth while sitting up. Appears in discomfort. Eyes: Conjunctivae are normal.  Head: Atraumatic. Nose: No congestion/rhinnorhea. Mouth/Throat: Mucous membranes are moist.  Neck: No stridor.   Cardiovascular: Normal rate, regular rhythm. Grossly normal heart sounds.   Respiratory: Normal respiratory effort.  No retractions. Lungs CTAB.  Mildly prolonged expiratory phase with an extra cough. Gastrointestinal: Soft with mild diffuse tenderness to palpation. No distention. No CVA tenderness. Musculoskeletal: No lower extremity tenderness nor edema.  No joint  effusions.  Mild tenderness to palpation to the low lumbar region to the midline as well as bilaterally without any deformity, step-off or ecchymosis.  Neurologic:  Normal speech and language. No gross focal neurologic deficits are appreciated. Skin:  Skin is warm, dry and intact. No rash noted. Psychiatric: Mood and affect are normal. Speech and behavior are normal.  ____________________________________________   LABS (all labs ordered are listed, but only abnormal results are displayed)  Labs Reviewed  CBC - Abnormal; Notable for the following:       Result Value   RDW 15.1 (*)    All other components within normal limits  BASIC METABOLIC PANEL  TROPONIN I  TROPONIN I  HEPATIC FUNCTION PANEL  URINALYSIS, COMPLETE (UACMP) WITH MICROSCOPIC  LIPASE, BLOOD   ____________________________________________  EKG  ED ECG REPORT I, Arelia LongestSchaevitz,  David M, the attending physician, personally viewed and interpreted this ECG.   Date: 07/16/2016  EKG Time: 0630  Rate: 77  Rhythm: normal sinus rhythm  Axis: normal  Intervals:none  ST&T Change: No ST segment elevation or depression. No abnormal T-wave inversion.  ____________________________________________  RADIOLOGY  No edema or consolidation seen on the chest x-ray. ____________________________________________   PROCEDURES  Procedure(s) performed:   Procedures  Critical Care performed:   ____________________________________________   INITIAL IMPRESSION / ASSESSMENT AND PLAN / ED COURSE  Pertinent labs & imaging results that were available during my care of the patient were reviewed by me and considered in my medical decision making (see chart for details).  ----------------------------------------- 10:51 AM on 07/16/2016 -----------------------------------------  Patient is now pain-free. Reports marked improvement in her shortness of breath and has been a relating abdominal hallway without assistance or without  any dyspnea. Abdomen is still mildly tender to palpation but very reassuring lab work. Patient able tolerate by mouth. She did not give urine and has all ready called a ride to pick her up and does not wish to stay for any further testing. She'll be discharged with treatment for COPD exacerbation. Other symptoms are likely chronic that is her back and hip pain. She is understanding the plan one to comply. Will be discharged home.      ____________________________________________   FINAL CLINICAL IMPRESSION(S) / ED DIAGNOSES  COPD exacerbation. Chronic pain. Abdominal pain.    NEW MEDICATIONS STARTED DURING THIS VISIT:  New Prescriptions   No medications on file     Note:  This document was prepared using Dragon voice recognition software and may include unintentional dictation errors.     Myrna BlazerSchaevitz, David Matthew, MD 07/16/16 1052

## 2016-07-16 NOTE — ED Notes (Signed)
Pt hitting call bell numerous times.  Explained have a very sick pt but have let dr Pershing Proudschaevitz know that she would like food.

## 2016-07-16 NOTE — ED Notes (Signed)
Ambulatory to bathroom.

## 2017-02-26 ENCOUNTER — Emergency Department: Payer: Medicaid Other

## 2017-02-26 ENCOUNTER — Emergency Department
Admission: EM | Admit: 2017-02-26 | Discharge: 2017-02-27 | Disposition: A | Discharge status: Home / Self Care | Payer: Medicaid Other | Attending: Emergency Medicine | Admitting: Emergency Medicine

## 2017-02-26 ENCOUNTER — Other Ambulatory Visit: Payer: Self-pay

## 2017-02-26 DIAGNOSIS — F17210 Nicotine dependence, cigarettes, uncomplicated: Secondary | ICD-10-CM | POA: Insufficient documentation

## 2017-02-26 DIAGNOSIS — J069 Acute upper respiratory infection, unspecified: Secondary | ICD-10-CM | POA: Diagnosis not present

## 2017-02-26 DIAGNOSIS — J441 Chronic obstructive pulmonary disease with (acute) exacerbation: Secondary | ICD-10-CM | POA: Diagnosis not present

## 2017-02-26 DIAGNOSIS — Z79899 Other long term (current) drug therapy: Secondary | ICD-10-CM | POA: Insufficient documentation

## 2017-02-26 DIAGNOSIS — J45909 Unspecified asthma, uncomplicated: Secondary | ICD-10-CM | POA: Diagnosis not present

## 2017-02-26 DIAGNOSIS — R079 Chest pain, unspecified: Secondary | ICD-10-CM | POA: Diagnosis not present

## 2017-02-26 DIAGNOSIS — R05 Cough: Secondary | ICD-10-CM | POA: Diagnosis present

## 2017-02-26 LAB — CBC WITH DIFFERENTIAL/PLATELET
BASOS ABS: 0.1 10*3/uL (ref 0–0.1)
BASOS PCT: 1 %
EOS PCT: 0 %
Eosinophils Absolute: 0 10*3/uL (ref 0–0.7)
HEMATOCRIT: 49.1 % — AB (ref 35.0–47.0)
Hemoglobin: 16.5 g/dL — ABNORMAL HIGH (ref 12.0–16.0)
LYMPHS ABS: 2.8 10*3/uL (ref 1.0–3.6)
LYMPHS PCT: 54 %
MCH: 30.4 pg (ref 26.0–34.0)
MCHC: 33.5 g/dL (ref 32.0–36.0)
MCV: 90.8 fL (ref 80.0–100.0)
MONO ABS: 0.7 10*3/uL (ref 0.2–0.9)
Monocytes Relative: 13 %
NEUTROS ABS: 1.7 10*3/uL (ref 1.4–6.5)
NEUTROS PCT: 32 %
PLATELETS: 205 10*3/uL (ref 150–440)
RBC: 5.41 MIL/uL — ABNORMAL HIGH (ref 3.80–5.20)
RDW: 14.3 % (ref 11.5–14.5)
WBC: 5.3 10*3/uL (ref 3.6–11.0)

## 2017-02-26 LAB — COMPREHENSIVE METABOLIC PANEL
ALBUMIN: 4.5 g/dL (ref 3.5–5.0)
ALT: 47 U/L (ref 14–54)
ANION GAP: 12 (ref 5–15)
AST: 65 U/L — AB (ref 15–41)
Alkaline Phosphatase: 53 U/L (ref 38–126)
BILIRUBIN TOTAL: 1 mg/dL (ref 0.3–1.2)
BUN: 14 mg/dL (ref 6–20)
CHLORIDE: 102 mmol/L (ref 101–111)
CO2: 25 mmol/L (ref 22–32)
Calcium: 8.8 mg/dL — ABNORMAL LOW (ref 8.9–10.3)
Creatinine, Ser: 0.61 mg/dL (ref 0.44–1.00)
GFR calc Af Amer: >60 mL/min (ref >60)
Glucose, Bld: 86 mg/dL (ref 65–99)
Potassium: 3.2 mmol/L — ABNORMAL LOW (ref 3.5–5.1)
Sodium: 139 mmol/L (ref 135–145)
TOTAL PROTEIN: 8.3 g/dL — AB (ref 6.5–8.1)

## 2017-02-26 LAB — LIPASE, BLOOD: Lipase: 40 U/L (ref 11–51)

## 2017-02-26 LAB — TROPONIN I

## 2017-02-26 LAB — INFLUENZA PANEL BY PCR (TYPE A & B)
INFLAPCR: NEGATIVE
INFLBPCR: NEGATIVE

## 2017-02-26 MED ORDER — ALBUTEROL SULFATE (2.5 MG/3ML) 0.083% IN NEBU
INHALATION_SOLUTION | RESPIRATORY_TRACT | Status: AC
  Filled 2017-02-26: qty 6

## 2017-02-26 MED ORDER — IPRATROPIUM-ALBUTEROL 0.5-2.5 (3) MG/3ML IN SOLN
3.0000 mL | Freq: Once | RESPIRATORY_TRACT | Status: AC
  Administered 2017-02-26: 3 mL via RESPIRATORY_TRACT
  Filled 2017-02-26: qty 3

## 2017-02-26 MED ORDER — METHYLPREDNISOLONE SODIUM SUCC 125 MG IJ SOLR
125.0000 mg | Freq: Once | INTRAMUSCULAR | Status: AC
Start: 2017-02-26 — End: 2017-02-26
  Administered 2017-02-26: 125 mg via INTRAVENOUS
  Filled 2017-02-26: qty 2

## 2017-02-26 MED ORDER — PREDNISONE 50 MG PO TABS: ORAL_TABLET | ORAL | 0 refills | Status: DC

## 2017-02-26 MED ORDER — IPRATROPIUM-ALBUTEROL 0.5-2.5 (3) MG/3ML IN SOLN
9.0000 mL | Freq: Once | RESPIRATORY_TRACT | Status: AC
  Administered 2017-02-26: 9 mL via RESPIRATORY_TRACT
  Filled 2017-02-26: qty 9

## 2017-02-26 MED ORDER — AZITHROMYCIN 250 MG PO TABS: ORAL_TABLET | ORAL | 0 refills | Status: AC

## 2017-02-26 MED ORDER — ONDANSETRON HCL 4 MG/2ML IJ SOLN
4.0000 mg | Freq: Once | INTRAMUSCULAR | Status: AC
  Administered 2017-02-26: 4 mg via INTRAVENOUS
  Filled 2017-02-26: qty 2

## 2017-02-26 MED ORDER — MORPHINE SULFATE (PF) 4 MG/ML IV SOLN
4.0000 mg | Freq: Once | INTRAVENOUS | Status: AC
  Administered 2017-02-26: 4 mg via INTRAVENOUS
  Filled 2017-02-26: qty 1

## 2017-02-26 MED ORDER — AZITHROMYCIN 500 MG PO TABS
500.0000 mg | ORAL_TABLET | Freq: Once | ORAL | Status: AC
  Administered 2017-02-26: 500 mg via ORAL
  Filled 2017-02-26: qty 1

## 2017-02-26 NOTE — ED Notes (Signed)
Patient transported to X-ray - will restart neb when she returns

## 2017-02-26 NOTE — ED Notes (Signed)
Pt unable to give urine sample at this time - pt given Sprite and to call when she has to void

## 2017-02-26 NOTE — ED Triage Notes (Signed)
Pt arrived via ems for c/o chest pain with productive cough and congestion - pt c/o flu symptoms and N/V/D for the last 4 days

## 2017-02-26 NOTE — ED Provider Notes (Signed)
[facility] Emergency Department Provider Note  ___________________________________________   None    (approximate)  I have reviewed the triage vital signs and the nursing notes.   HISTORY  Chief Complaint Chest Pain and Cough   HPI Briana Allen is a 57 y.o. female with a history of COPD as well as chronic back pain who is presenting with 4 days of cough, loss of her voice as well as diffuse abdominal pain, central chest pain and vomiting.  Per EMS, she had been having episodes of posttussive vomitus and also coughing of yellow sputum.  Patient thinks that she may have caught this illness from a young child.  Patient denies fever.  Says that she is having a tightness across the front of her chest which becomes sharp when she coughs.  Does not report diarrhea.  Patient says that she took "3 steroid" pills prior to arrival.  On referencing her home meds it looks like this was likely a total of 15 mg of prednisolone.  She says that she took these pills about an hour ago.  Past Medical History:  Diagnosis Date  . Asthma   . Chronic back pain   . COPD (chronic obstructive pulmonary disease) (HCC)     There are no active problems to display for this patient.   Past Surgical History:  Procedure Laterality Date  . ABDOMINAL HYSTERECTOMY      Prior to Admission medications   Medication Sig Start Date End Date Taking? Authorizing Provider  albuterol (PROVENTIL HFA;VENTOLIN HFA) 108 (90 Base) MCG/ACT inhaler Inhale into the lungs every 6 (six) hours as needed for wheezing or shortness of breath.    [provider]  baclofen (LIORESAL) 10 MG tablet Take 10 mg by mouth 3 (three) times daily as needed for muscle spasms.    [provider]  diazepam (VALIUM) 5 MG tablet Take 1 tablet (5 mg total) by mouth every 8 (eight) hours as needed for muscle spasms. 05/29/15   Sharman Cheek, MD  diphenhydrAMINE (BENADRYL) 25 mg capsule Take 25 mg by  mouth every 6 (six) hours as needed.    [provider]  ipratropium (ATROVENT HFA) 17 MCG/ACT inhaler Inhale 2 puffs into the lungs every 6 (six) hours.    [provider]  levocetirizine (XYZAL) 5 MG tablet Take 5 mg by mouth every evening.    [provider]  meloxicam (MOBIC) 15 MG tablet Take 1 tablet (15 mg total) by mouth daily. Patient not taking: Reported on 07/16/2016 08/24/15   Cuthriell, Delorise Royals, PA-C  omeprazole (PRILOSEC) 20 MG capsule Take 20 mg by mouth daily.    [provider]  oxyCODONE-acetaminophen (ROXICET) 5-325 MG tablet Take 1 tablet by mouth every 6 (six) hours as needed for severe pain. Patient not taking: Reported on 07/16/2016 05/29/15   Sharman Cheek, MD  prednisoLONE 5 MG TABS tablet Take 10 mg by mouth daily.    [provider]  predniSONE (DELTASONE) 50 MG tablet Take 1 tab daily x5 days 07/16/16   Myrna Blazer, MD  QUEtiapine (SEROQUEL) 400 MG tablet Take 400 mg by mouth at bedtime.    [provider]  traMADol (ULTRAM) 50 MG tablet Take by mouth every 6 (six) hours as needed.    [provider]  traZODone (DESYREL) 150 MG tablet Take by mouth at bedtime.    [provider]    Allergies Ampicillin  No family history on file.  Social History Social  History   Tobacco Use  . Smoking status: Current Every Day Smoker    Types: Cigarettes  . Smokeless tobacco: Never Used  Substance Use Topics  . Alcohol use: No  . Drug use: No    Review of Systems  Constitutional: No fever/chills Eyes: No visual changes. ENT: No sore throat. Cardiovascular: As above Respiratory: As above. Gastrointestinal:  No diarrhea.  No constipation. Genitourinary: Negative for dysuria. Musculoskeletal: Negative for back pain. Skin: Negative for rash. Neurological: Negative for headaches, focal weakness or numbness.   ____________________________________________   PHYSICAL  EXAM:  VITAL SIGNS: ED Triage Vitals  Enc Vitals Group     BP --      Pulse --      Resp --      Temp --      Temp src --      SpO2 02/26/17 2120 97 %     Weight 02/26/17 2121 200 lb (90.7 kg)     Height 02/26/17 2121 5\' 6"  (1.676 m)     Head Circumference --      Peak Flow --      Pain Score 02/26/17 2120 6     Pain Loc --      Pain Edu? --      Excl. in GC? --     Constitutional: Alert and oriented. Well appearing and in no acute distress.  Hoarse voice but controlling her secretions. Eyes: Conjunctivae are normal.  Head: Atraumatic. Nose: No congestion/rhinnorhea. Mouth/Throat: Mucous membranes are moist.  No swelling nor pus or erythema to the structures of the posterior pharynx. Neck: No stridor.   Cardiovascular: Normal rate, regular rhythm. Grossly normal heart sounds.  Chest pain is reproducible over the mid sternum. Respiratory: Tachypneic with a prolonged expiratory phase.  Able to speak in full sentences.  However, the patient has diffuse, coarse wheezing throughout all fields. Gastrointestinal: Soft but with mild and diffuse tenderness to palpation.  No distention. No CVA tenderness. Musculoskeletal: No lower extremity tenderness nor edema.  No joint effusions. Neurologic:  Normal speech and language. No gross focal neurologic deficits are appreciated. Skin:  Skin is warm, dry and intact. No rash noted. Psychiatric: Mood and affect are normal. Speech and behavior are normal.  ____________________________________________   LABS (all labs ordered are listed, but only abnormal results are displayed)  Labs Reviewed  CBC WITH DIFFERENTIAL/PLATELET - Abnormal; Notable for the following components:      Result Value   RBC 5.41 (*)    Hemoglobin 16.5 (*)    HCT 49.1 (*)    All other components within normal limits  COMPREHENSIVE METABOLIC PANEL - Abnormal; Notable for the following components:   Potassium 3.2 (*)    Calcium 8.8 (*)    Total Protein 8.3 (*)     AST 65 (*)    All other components within normal limits  TROPONIN I  LIPASE, BLOOD  INFLUENZA PANEL BY PCR (TYPE A & B)  URINALYSIS, COMPLETE (UACMP) WITH MICROSCOPIC   ____________________________________________  EKG  ED ECG REPORT I, Arelia Longestavid M Schaevitz, the attending physician, personally viewed and interpreted this ECG.   Date: 02/26/2017  EKG Time: 2129  Rate: 62  Rhythm: normal sinus rhythm  Axis: Normal  Intervals:none  ST&T Change: No ST segment elevation or depression.  No abnormal T wave inversion.  ____________________________________________  RADIOLOGY  No active cardiopulmonary disease. ____________________________________________   PROCEDURES  Procedure(s) performed:   Procedures  Critical Care performed:   ____________________________________________  INITIAL IMPRESSION / ASSESSMENT AND PLAN / ED COURSE  Pertinent labs & imaging results that were available during my care of the patient were reviewed by me and considered in my medical decision making (see chart for details).  Differential includes, but is not limited to, viral syndrome, bronchitis including COPD exacerbation, pneumonia, reactive airway disease including asthma, CHF including exacerbation with or without pulmonary/interstitial edema, pneumothorax, ACS, thoracic trauma, and pulmonary embolism. Differential diagnosis includes, but is not limited to, ACS, aortic dissection, pulmonary embolism, cardiac tamponade, pneumothorax, pneumonia, pericarditis, myocarditis, GI-related causes including esophagitis/gastritis, and musculoskeletal chest wall pain.   Differential diagnosis includes, but is not limited to, ovarian cyst, ovarian torsion, acute appendicitis, diverticulitis, urinary tract infection/pyelonephritis, endometriosis, bowel obstruction, colitis, renal colic, gastroenteritis, hernia, fibroids, endometriosis, pregnancy related pain including ectopic pregnancy, etc. As part of my  medical decision making, I reviewed the following data within the electronic MEDICAL RECORD NUMBER Notes from prior ED visits   ----------------------------------------- 11:08 PM on 02/26/2017 -----------------------------------------  Patient so far with reassuring lab work as well as chest x-ray.  Signed out to Dr. Lamont Snowball to reevaluate the patient for improvement in her respiratory status.  Reassessment will determine whether the patient will require admission to the hospital versus discharge to home.      ____________________________________________   FINAL CLINICAL IMPRESSION(S) / ED DIAGNOSES  Chest pain.  URI.  COPD exacerbation.   NEW MEDICATIONS STARTED DURING THIS VISIT:  New Prescriptions   No medications on file     Note:  This document was prepared using Dragon voice recognition software and may include unintentional dictation errors.     Myrna Blazer, MD 02/26/17 [phone]

## 2017-02-27 MED ORDER — IBUPROFEN 600 MG PO TABS
600.0000 mg | ORAL_TABLET | Freq: Once | ORAL | Status: AC
  Administered 2017-02-27: 600 mg via ORAL
  Filled 2017-02-27: qty 1

## 2017-02-27 NOTE — ED Provider Notes (Signed)
Feels improved.  Lungs more clear.  Stable for discharge.  Saturating well on room air.   Merrily Brittleifenbark, Farra Nikolic, MD 02/27/17 [phone]

## 2017-02-27 NOTE — ED Notes (Signed)
Pt taken to lobby via a wheel chair and [facility] has been called. First nurse aware. Pt has phone, keys, cloths and hat. NAD at this time.

## 2017-11-23 ENCOUNTER — Emergency Department (HOSPITAL_COMMUNITY): Payer: Medicaid Other

## 2017-11-23 ENCOUNTER — Other Ambulatory Visit: Payer: Self-pay

## 2017-11-23 ENCOUNTER — Emergency Department (HOSPITAL_COMMUNITY): Payer: Medicaid Other | Admitting: Anesthesiology

## 2017-11-23 ENCOUNTER — Ambulatory Visit (HOSPITAL_COMMUNITY)
Admission: EM | Admit: 2017-11-23 | Discharge: 2017-11-23 | Disposition: A | Discharge status: Home / Self Care | Payer: Medicaid Other | Attending: Emergency Medicine | Admitting: Emergency Medicine

## 2017-11-23 ENCOUNTER — Encounter (HOSPITAL_COMMUNITY): Payer: Self-pay | Admitting: Emergency Medicine

## 2017-11-23 ENCOUNTER — Encounter (HOSPITAL_COMMUNITY)
Admission: EM | Disposition: A | Discharge status: Home / Self Care | Payer: Self-pay | Source: Home / Self Care | Attending: Emergency Medicine

## 2017-11-23 DIAGNOSIS — J449 Chronic obstructive pulmonary disease, unspecified: Secondary | ICD-10-CM | POA: Insufficient documentation

## 2017-11-23 DIAGNOSIS — Z7952 Long term (current) use of systemic steroids: Secondary | ICD-10-CM | POA: Diagnosis not present

## 2017-11-23 DIAGNOSIS — S42252A Displaced fracture of greater tuberosity of left humerus, initial encounter for closed fracture: Secondary | ICD-10-CM | POA: Diagnosis not present

## 2017-11-23 DIAGNOSIS — F17210 Nicotine dependence, cigarettes, uncomplicated: Secondary | ICD-10-CM | POA: Insufficient documentation

## 2017-11-23 DIAGNOSIS — S43005A Unspecified dislocation of left shoulder joint, initial encounter: Secondary | ICD-10-CM

## 2017-11-23 DIAGNOSIS — W1830XA Fall on same level, unspecified, initial encounter: Secondary | ICD-10-CM | POA: Diagnosis not present

## 2017-11-23 DIAGNOSIS — Z79899 Other long term (current) drug therapy: Secondary | ICD-10-CM | POA: Diagnosis not present

## 2017-11-23 DIAGNOSIS — S43006A Unspecified dislocation of unspecified shoulder joint, initial encounter: Secondary | ICD-10-CM

## 2017-11-23 DIAGNOSIS — T148XXA Other injury of unspecified body region, initial encounter: Secondary | ICD-10-CM

## 2017-11-23 DIAGNOSIS — M21922 Unspecified acquired deformity of left upper arm: Secondary | ICD-10-CM | POA: Diagnosis present

## 2017-11-23 DIAGNOSIS — S42292A Other displaced fracture of upper end of left humerus, initial encounter for closed fracture: Secondary | ICD-10-CM

## 2017-11-23 HISTORY — PX: SHOULDER CLOSED REDUCTION: SHX1051

## 2017-11-23 LAB — CBC WITH DIFFERENTIAL/PLATELET
ABS IMMATURE GRANULOCYTES: 0.07 10*3/uL (ref 0.00–0.07)
BASOS ABS: 0.1 10*3/uL (ref 0.0–0.1)
Basophils Relative: 1 %
Eosinophils Absolute: 0 10*3/uL (ref 0.0–0.5)
Eosinophils Relative: 0 %
HEMATOCRIT: 44.6 % (ref 36.0–46.0)
HEMOGLOBIN: 13.8 g/dL (ref 12.0–15.0)
Immature Granulocytes: 1 %
LYMPHS ABS: 2.4 10*3/uL (ref 0.7–4.0)
Lymphocytes Relative: 17 %
MCH: 29.8 pg (ref 26.0–34.0)
MCHC: 30.9 g/dL (ref 30.0–36.0)
MCV: 96.3 fL (ref 80.0–100.0)
Monocytes Absolute: 0.8 10*3/uL (ref 0.1–1.0)
Monocytes Relative: 6 %
NRBC: 0 % (ref 0.0–0.2)
Neutro Abs: 10.8 10*3/uL — ABNORMAL HIGH (ref 1.7–7.7)
Neutrophils Relative %: 75 %
Platelets: 200 10*3/uL (ref 150–400)
RBC: 4.63 MIL/uL (ref 3.87–5.11)
RDW: 13.8 % (ref 11.5–15.5)
WBC: 14.1 10*3/uL — ABNORMAL HIGH (ref 4.0–10.5)

## 2017-11-23 LAB — COMPREHENSIVE METABOLIC PANEL
ALBUMIN: 3.9 g/dL (ref 3.5–5.0)
ALT: 19 U/L (ref 0–44)
AST: 23 U/L (ref 15–41)
Alkaline Phosphatase: 59 U/L (ref 38–126)
Anion gap: 8 (ref 5–15)
BILIRUBIN TOTAL: 0.4 mg/dL (ref 0.3–1.2)
BUN: 12 mg/dL (ref 6–20)
CALCIUM: 8.6 mg/dL — AB (ref 8.9–10.3)
CO2: 23 mmol/L (ref 22–32)
Chloride: 108 mmol/L (ref 98–111)
Creatinine, Ser: 0.75 mg/dL (ref 0.44–1.00)
GFR calc Af Amer: >60 mL/min (ref >60)
GFR calc non Af Amer: >60 mL/min (ref >60)
GLUCOSE: 129 mg/dL — AB (ref 70–99)
POTASSIUM: 3.6 mmol/L (ref 3.5–5.1)
Sodium: 139 mmol/L (ref 135–145)
TOTAL PROTEIN: 6.2 g/dL — AB (ref 6.5–8.1)

## 2017-11-23 SURGERY — CLOSED REDUCTION, SHOULDER
Anesthesia: General | Site: Arm Upper | Laterality: Left

## 2017-11-23 MED ORDER — PROPOFOL 10 MG/ML IV BOLUS
INTRAVENOUS | Status: AC
  Filled 2017-11-23: qty 20

## 2017-11-23 MED ORDER — LACTATED RINGERS IV SOLN
INTRAVENOUS | Status: DC | PRN
  Administered 2017-11-23: 08:00:00 via INTRAVENOUS

## 2017-11-23 MED ORDER — FENTANYL CITRATE (PF) 250 MCG/5ML IJ SOLN
INTRAMUSCULAR | Status: AC
  Filled 2017-11-23: qty 5

## 2017-11-23 MED ORDER — OXYCODONE HCL 5 MG PO TABS
5.0000 mg | ORAL_TABLET | Freq: Once | ORAL | Status: AC | PRN
  Administered 2017-11-23: 5 mg via ORAL

## 2017-11-23 MED ORDER — MEPERIDINE HCL 50 MG/ML IJ SOLN: 6.2500 mg | INTRAMUSCULAR | Status: DC | PRN

## 2017-11-23 MED ORDER — OXYCODONE HCL 5 MG/5ML PO SOLN: 5.0000 mg | Freq: Once | ORAL | Status: AC | PRN

## 2017-11-23 MED ORDER — KETOROLAC TROMETHAMINE 15 MG/ML IJ SOLN
INTRAMUSCULAR | Status: AC
  Filled 2017-11-23: qty 1

## 2017-11-23 MED ORDER — ETOMIDATE 2 MG/ML IV SOLN
INTRAVENOUS | Status: AC
  Filled 2017-11-23: qty 10

## 2017-11-23 MED ORDER — MIDAZOLAM HCL 5 MG/5ML IJ SOLN
INTRAMUSCULAR | Status: DC | PRN
  Administered 2017-11-23: 1 mg via INTRAVENOUS

## 2017-11-23 MED ORDER — HYDROMORPHONE HCL 1 MG/ML IJ SOLN
1.0000 mg | Freq: Once | INTRAMUSCULAR | Status: AC | PRN
  Administered 2017-11-23: 1 mg via INTRAVENOUS
  Filled 2017-11-23: qty 1

## 2017-11-23 MED ORDER — ONDANSETRON 4 MG PO TBDP: 4.0000 mg | ORAL_TABLET | Freq: Three times a day (TID) | ORAL | 0 refills | Status: AC | PRN

## 2017-11-23 MED ORDER — SUCCINYLCHOLINE CHLORIDE 20 MG/ML IJ SOLN
INTRAMUSCULAR | Status: DC | PRN
  Administered 2017-11-23: 40 mg via INTRAVENOUS

## 2017-11-23 MED ORDER — ETOMIDATE 2 MG/ML IV SOLN
INTRAVENOUS | Status: AC | PRN
  Administered 2017-11-23: 10 mg via INTRAVENOUS

## 2017-11-23 MED ORDER — LIDOCAINE 2% (20 MG/ML) 5 ML SYRINGE
INTRAMUSCULAR | Status: DC | PRN
  Administered 2017-11-23: 100 mg via INTRAVENOUS

## 2017-11-23 MED ORDER — PROPOFOL 10 MG/ML IV BOLUS
INTRAVENOUS | Status: AC | PRN
  Administered 2017-11-23 (×3): 55 mg via INTRAVENOUS

## 2017-11-23 MED ORDER — PROPOFOL 10 MG/ML IV BOLUS
0.5000 mg/kg | Freq: Once | INTRAVENOUS | Status: AC
  Administered 2017-11-23: 150 mg via INTRAVENOUS

## 2017-11-23 MED ORDER — FENTANYL CITRATE (PF) 100 MCG/2ML IJ SOLN
INTRAMUSCULAR | Status: AC
  Filled 2017-11-23: qty 2

## 2017-11-23 MED ORDER — MIDAZOLAM HCL 2 MG/2ML IJ SOLN
INTRAMUSCULAR | Status: AC
  Filled 2017-11-23: qty 2

## 2017-11-23 MED ORDER — OXYCODONE HCL 5 MG PO TABS
ORAL_TABLET | ORAL | Status: AC
  Filled 2017-11-23: qty 1

## 2017-11-23 MED ORDER — HYDROCODONE-ACETAMINOPHEN 5-325 MG PO TABS: 1.0000 | ORAL_TABLET | Freq: Three times a day (TID) | ORAL | 0 refills | Status: AC | PRN

## 2017-11-23 MED ORDER — KETOROLAC TROMETHAMINE 15 MG/ML IJ SOLN
15.0000 mg | Freq: Once | INTRAMUSCULAR | Status: AC
  Administered 2017-11-23: 15 mg via INTRAVENOUS
  Filled 2017-11-23: qty 1

## 2017-11-23 MED ORDER — FENTANYL CITRATE (PF) 100 MCG/2ML IJ SOLN
25.0000 ug | INTRAMUSCULAR | Status: DC | PRN
  Administered 2017-11-23: 25 ug via INTRAVENOUS

## 2017-11-23 MED ORDER — PROPOFOL 10 MG/ML IV BOLUS
INTRAVENOUS | Status: AC
  Filled 2017-11-23: qty 40

## 2017-11-23 MED ORDER — FENTANYL CITRATE (PF) 100 MCG/2ML IJ SOLN
INTRAMUSCULAR | Status: DC | PRN
  Administered 2017-11-23 (×2): 50 ug via INTRAVENOUS

## 2017-11-23 MED ORDER — KETOROLAC TROMETHAMINE 10 MG PO TABS: 10.0000 mg | ORAL_TABLET | Freq: Four times a day (QID) | ORAL | 0 refills | Status: AC | PRN

## 2017-11-23 MED ORDER — HYDROMORPHONE HCL 1 MG/ML IJ SOLN
1.0000 mg | Freq: Once | INTRAMUSCULAR | Status: AC
  Administered 2017-11-23: 1 mg via INTRAVENOUS
  Filled 2017-11-23: qty 1

## 2017-11-23 MED ORDER — NALOXONE HCL 4 MG/0.1ML NA LIQD: 1.0000 | Freq: Once | NASAL | 0 refills | Status: AC

## 2017-11-23 MED ORDER — PROMETHAZINE HCL 25 MG/ML IJ SOLN: 6.2500 mg | INTRAMUSCULAR | Status: DC | PRN

## 2017-11-23 SURGICAL SUPPLY — 1 items: SLING ARM FOAM STRAP XLG (SOFTGOODS) ×3 IMPLANT

## 2017-11-23 NOTE — Anesthesia Procedure Notes (Signed)
Procedure Name: General with mask airway Date/Time: 11/23/2017 7:59 AM Performed by: Gwenyth AllegraAdami, Shakedra Beam, CRNA Pre-anesthesia Checklist: Patient identified, Emergency Drugs available, Suction available, Patient being monitored and Timeout performed Patient Re-evaluated:Patient Re-evaluated prior to induction Oxygen Delivery Method: Circle system utilized Preoxygenation: Pre-oxygenation with 100% oxygen Induction Type: IV induction Ventilation: Mask ventilation without difficulty

## 2017-11-23 NOTE — Brief Op Note (Signed)
11/23/2017  8:18 AM  PATIENT:  Briana BrakemanDonna Jean Allen  57 y.o. female  PRE-OPERATIVE DIAGNOSIS:  DISLOCATED SHOULDER, LEFT  POST-OPERATIVE DIAGNOSIS:  DISLOCATED SHOULDER, LEFT  PROCEDURE:  Procedure(s): CLOSED REDUCTION SHOULDER (Left)  STRESS EVALUATION UNDER FLUOROSCOPY  SURGEON:  Surgeon(s) and Role:    Myrene Galas* Zamari Vea, MD - Primary  ASSISTANTS: none   ANESTHESIA:   general  EBL:  NONE  BLOOD ADMINISTERED:none  DRAINS: none   LOCAL MEDICATIONS USED:  NONE  SPECIMEN:  No Specimen  DISPOSITION OF SPECIMEN:  N/A  COUNTS:  YES  TOURNIQUET:  * No tourniquets in log *  DICTATION: .Other Dictation: Dictation Number [id number]  PLAN OF CARE: Discharge to home after PACU  PATIENT DISPOSITION:  PACU - hemodynamically stable.   Delay start of Pharmacological VTE agent (>24hrs) due to surgical blood loss or risk of bleeding: not applicable

## 2017-11-23 NOTE — ED Triage Notes (Signed)
[place] and landing on her left shoulder. Was helping out taking out trash. Obvious deformity.

## 2017-11-23 NOTE — Discharge Instructions (Addendum)
Orthopaedic Trauma Service Discharge Instructions   General Discharge Instructions  WEIGHT BEARING STATUS: Nonweightbearing Left upper extremity, no lifting with left arm   RANGE OF MOTION/ACTIVITY: ok to gently move left shoulder. Ok to come out of sling periodically to move elbow. Continue to move forearm, wrist and fingers. Sling on at all times except for when doing range of motion exercises   Wound Care: ok to clean left arm as needed   Diet: as you were eating previously.  Can use over the counter stool softeners and bowel preparations, such as Miralax, to help with bowel movements.  Narcotics can be constipating.  Be sure to drink plenty of fluids  PAIN MEDICATION USE AND EXPECTATIONS  You have likely been given narcotic medications to help control your pain.  After a traumatic event that results in an fracture (broken bone) with or without surgery, it is ok to use narcotic pain medications to help control one's pain.  We understand that everyone responds to pain differently and each individual patient will be evaluated on a regular basis for the continued need for narcotic medications. Ideally, narcotic medication use should last no more than 6-8 weeks (coinciding with fracture healing).   As a patient it is your responsibility as well to monitor narcotic medication use and report the amount and frequency you use these medications when you come to your office visit.   We would also advise that if you are using narcotic medications, you should take a dose prior to therapy to maximize you participation.  IF YOU ARE ON NARCOTIC MEDICATIONS IT IS NOT PERMISSIBLE TO OPERATE A MOTOR VEHICLE (MOTORCYCLE/CAR/TRUCK/MOPED) OR HEAVY MACHINERY DO NOT MIX NARCOTICS WITH OTHER CNS (CENTRAL NERVOUS SYSTEM) DEPRESSANTS SUCH AS ALCOHOL   STOP SMOKING OR USING NICOTINE PRODUCTS!!!!  As discussed nicotine severely impairs your body's ability to heal surgical and traumatic wounds but also impairs bone  healing.  Wounds and bone heal by forming microscopic blood vessels (angiogenesis) and nicotine is a vasoconstrictor (essentially, shrinks blood vessels).  Therefore, if vasoconstriction occurs to these microscopic blood vessels they essentially disappear and are unable to deliver necessary nutrients to the healing tissue.  This is one modifiable factor that you can do to dramatically increase your chances of healing your injury.    (This means no smoking, no nicotine gum, patches, etc)  DO NOT USE NONSTEROIDAL ANTI-INFLAMMATORY DRUGS (NSAID'S)  Using products such as Advil (ibuprofen), Aleve (naproxen), Motrin (ibuprofen) for additional pain control during fracture healing can delay and/or prevent the healing response.  If you would like to take over the counter (OTC) medication, Tylenol (acetaminophen) is ok.  However, some narcotic medications that are given for pain control contain acetaminophen as well. Therefore, you should not exceed more than 4000 mg of tylenol in a day if you do not have liver disease.  Also note that there are may OTC medicines, such as cold medicines and allergy medicines that my contain tylenol as well.  If you have any questions about medications and/or interactions please ask your doctor/PA or your pharmacist.      ICE AND ELEVATE INJURED/OPERATIVE EXTREMITY  Using ice and elevating the injured extremity above your heart can help with swelling and pain control.  Icing in a pulsatile fashion, such as 20 minutes on and 20 minutes off, can be followed.    Do not place ice directly on skin. Make sure there is a barrier between to skin and the ice pack.    Using frozen  items such as frozen peas works well as the conform nicely to the are that needs to be iced.  USE AN ACE WRAP OR TED HOSE FOR SWELLING CONTROL  In addition to icing and elevation, Ace wraps or TED hose are used to help limit and resolve swelling.  It is recommended to use Ace wraps or TED hose until you are  informed to stop.    When using Ace Wraps start the wrapping distally (farthest away from the body) and wrap proximally (closer to the body)   Example: If you had surgery on your leg or thing and you do not have a splint on, start the ace wrap at the toes and work your way up to the thigh        If you had surgery on your upper extremity and do not have a splint on, start the ace wrap at your fingers and work your way up to the upper arm  IF YOU ARE IN A SPLINT OR CAST DO NOT REMOVE IT FOR ANY REASON   If your splint gets wet for any reason please contact the office immediately. You may shower in your splint or cast as long as you keep it dry.  This can be done by wrapping in a cast cover or garbage back (or similar)  Do Not stick any thing down your splint or cast such as pencils, money, or hangers to try and scratch yourself with.  If you feel itchy take benadryl as prescribed on the bottle for itching  IF YOU ARE IN A CAM BOOT (BLACK BOOT)  You may remove boot periodically. Perform daily dressing changes as noted below.  Wash the liner of the boot regularly and wear a sock when wearing the boot. It is recommended that you sleep in the boot until told otherwise  CALL THE OFFICE WITH ANY QUESTIONS OR CONCERNS: [phone]

## 2017-11-23 NOTE — Transfer of Care (Signed)
Immediate Anesthesia Transfer of Care Note  Patient: Briana BrakemanDonna Jean Allen  Procedure(s) Performed: CLOSED REDUCTION SHOULDER (Left Arm Upper)  Patient Location: PACU  Anesthesia Type:General  Level of Consciousness: awake, alert  and oriented  Airway & Oxygen Therapy: Patient Spontanous Breathing and Patient connected to nasal cannula oxygen  Post-op Assessment: Report given to RN and Post -op Vital signs reviewed and stable  Post vital signs: Reviewed and stable  Last Vitals:  Vitals Value Taken Time  BP 175/86 11/23/2017  8:24 AM  Temp    Pulse 94 11/23/2017  8:30 AM  Resp 22 11/23/2017  8:30 AM  SpO2 94 % 11/23/2017  8:30 AM  Vitals shown include unvalidated device data.  Last Pain:  Vitals:   11/23/17 0400  PainSc: 10-Worst pain ever         Complications: No apparent anesthesia complications

## 2017-11-23 NOTE — ED Provider Notes (Signed)
  I was asked to assist with shoulder reduction, which was ultimately unsuccessful in the ED.  ED Course/Procedures     Reduction of dislocation Date/Time: 11/23/2017 6:27 AM Performed by: Roxy HorsemanBrowning, Ivee Poellnitz, PA-C Authorized by: Roxy HorsemanBrowning, Lennis Rader, PA-C  Unsuccessful attempt Consent: Verbal consent obtained. Written consent obtained. Risks and benefits: risks, benefits and alternatives were discussed Consent given by: patient Patient understanding: patient states understanding of the procedure being performed Patient consent: the patient's understanding of the procedure matches consent given Procedure consent: procedure consent matches procedure scheduled Relevant documents: relevant documents present and verified Test results: test results available and properly labeled Site marked: the operative site was marked Imaging studies: imaging studies available Required items: required blood products, implants, devices, and special equipment available Patient identity confirmed: verbally with patient Time out: Immediately prior to procedure a "time out" was called to verify the correct patient, procedure, equipment, support staff and site/side marked as required. Local anesthesia used: no  Anesthesia: Local anesthesia used: no  Sedation: Patient sedated: yes (See Dr. Danielle RankinMesner's sedation note)  Patient tolerance: Patient tolerated the procedure well with no immediate complications           Roxy HorsemanBrowning, Bryauna Byrum, PA-C 11/23/17 [id number]    Mesner, Barbara CowerJason, MD 11/23/17 [phone]

## 2017-11-23 NOTE — ED Notes (Addendum)
[facility] Stores[facility] Pet[facility]  [phone]---sister

## 2017-11-23 NOTE — Progress Notes (Signed)
Orthopedic Tech Progress Note Patient Details:  Briana BrakemanDonna Jean Allen [birth year] [id number]  Ortho Devices Type of Ortho Device: Shoulder immobilizer Ortho Device/Splint Interventions: Application   Post Interventions Patient Tolerated: Well Instructions Provided: Care of device, Adjustment of device   Norva KarvonenWalls, Tenaya Hilyer T 11/23/2017, 5:35 AM

## 2017-11-23 NOTE — Anesthesia Preprocedure Evaluation (Signed)
Anesthesia Evaluation  Patient identified by MRN, date of birth, ID band Patient awake    Reviewed: Allergy & Precautions, NPO status , Patient's Chart, lab work & pertinent test results  Airway Mallampati: II  TM Distance: >3 FB Neck ROM: Full    Dental  (+) Dental Advisory Given   Pulmonary asthma , COPD, Current Smoker,    Pulmonary exam normal breath sounds clear to auscultation       Cardiovascular negative cardio ROS Normal cardiovascular exam Rhythm:Regular Rate:Normal     Neuro/Psych negative neurological ROS  negative psych ROS   GI/Hepatic negative GI ROS, Neg liver ROS,   Endo/Other  negative endocrine ROS  Renal/GU negative Renal ROS     Musculoskeletal negative musculoskeletal ROS (+)   Abdominal (+) + obese,   Peds  Hematology negative hematology ROS (+)   Anesthesia Other Findings   Reproductive/Obstetrics negative OB ROS                             Anesthesia Physical Anesthesia Plan  ASA: III  Anesthesia Plan: General   Post-op Pain Management:    Induction: Intravenous  PONV Risk Score and Plan: 3 and Ondansetron, Dexamethasone, Midazolam and Treatment may vary due to age or medical condition  Airway Management Planned: LMA  Additional Equipment:   Intra-op Plan:   Post-operative Plan: Extubation in OR  Informed Consent: I have reviewed the patients History and Physical, chart, labs and discussed the procedure including the risks, benefits and alternatives for the proposed anesthesia with the patient or authorized representative who has indicated his/her understanding and acceptance.   Dental advisory given  Plan Discussed with: CRNA  Anesthesia Plan Comments:         Anesthesia Quick Evaluation

## 2017-11-23 NOTE — ED Provider Notes (Signed)
Emergency Department Provider Note   I have reviewed the triage vital signs and the nursing notes.   HISTORY  Chief Complaint Shoulder Injury   HPI Briana Allen is a 57 y.o. female who had a mechanical fall with subsequent left shoulder deformity and pain.  EMS picked up and give her to a micrograms of fentanyl which did not seem to help the pain brought here for further evaluation.  Patient is not hurting elsewhere.  She has no skin lesions.  Never had this problem before. No other associated or modifying symptoms.    Past Medical History:  Diagnosis Date  . Asthma   . Chronic back pain   . COPD (chronic obstructive pulmonary disease) (HCC)     There are no active problems to display for this patient.   Past Surgical History:  Procedure Laterality Date  . ABDOMINAL HYSTERECTOMY      Current Outpatient Rx  . Order #: [id number] Class: Historical Med  . Order #: [id number] Class: Historical Med  . Order #: [id number] Class: Print  . Order #: [id number] Class: Historical Med  . Order #: [id number] Class: Historical Med  . Order #: [id number] Class: Historical Med  . Order #: [id number] Class: Print  . Order #: [id number] Class: Historical Med  . Order #: [id number] Class: Print  . Order #: [id number] Class: Historical Med  . Order #: [id number] Class: Print  . Order #: [id number] Class: Historical Med  . Order #: [id number] Class: Historical Med  . Order #: [id number] Class: Historical Med    Allergies Ampicillin  No family history on file.  Social History Social History   Tobacco Use  . Smoking status: Current Every Day Smoker    Types: Cigarettes  . Smokeless tobacco: Never Used  Substance Use Topics  . Alcohol use: No  . Drug use: No    Review of Systems  All other systems negative except as documented in the HPI. All pertinent positives and negatives as reviewed in the HPI. ____________________________________________   PHYSICAL EXAM:  VITAL SIGNS: Vitals:     11/23/17 0512 11/23/17 0530  BP: (!) 168/146 (!) 150/126  Pulse: 80 83  Resp: (!) 23 18  SpO2: 96% 96%     Constitutional: Alert and oriented. Well appearing and in no acute distress. Eyes: Conjunctivae are normal. PERRL. EOMI. Head: Atraumatic. Nose: No congestion/rhinnorhea. Mouth/Throat: Mucous membranes are moist.  Oropharynx non-erythematous. Neck: No stridor.  No meningeal signs.   Cardiovascular: Normal rate, regular rhythm. Good peripheral circulation. Grossly normal heart sounds.   Respiratory: Normal respiratory effort.  No retractions. Lungs CTAB. Gastrointestinal: Soft and nontender. No distention.  Musculoskeletal: Left shoulder deformity with obvious discomfort.  Neurovascular intact. Neurologic:  Normal speech and language. No gross focal neurologic deficits are appreciated.  Skin:  Skin is warm, dry and intact. No rash noted.  ____________________________________________   LABS (all labs ordered are listed, but only abnormal results are displayed)  Labs Reviewed  CBC WITH DIFFERENTIAL/PLATELET - Abnormal; Notable for the following components:      Result Value   WBC 14.1 (*)    Neutro Abs 10.8 (*)    All other components within normal limits  COMPREHENSIVE METABOLIC PANEL - Abnormal; Notable for the following components:   Glucose, Bld 129 (*)    Calcium 8.6 (*)    Total Protein 6.2 (*)    All other components within normal limits   ____________________________________________  RADIOLOGY  Dg Shoulder Left  Result Date: 11/23/2017 CLINICAL DATA:  Fall EXAM: LEFT SHOULDER - 2+ VIEW COMPARISON:  None. FINDINGS: There is anterior dislocation at the left glenohumeral joint with a mildly displaced fracture of the posterolateral humeral head. Acromioclavicular joint is approximated. No clavicle fracture visualized. IMPRESSION: Anterior dislocation of the left glenohumeral joint with mildly displaced fracture of the posterolateral humeral head. Electronically  Signed   By: Deatra Robinson M.D.   On: 11/23/2017 04:32    ____________________________________________   PROCEDURES  Procedure(s) performed:   .Sedation Date/Time: 11/23/2017 6:44 AM Performed by: Marily Memos, MD Authorized by: Marily Memos, MD   Consent:    Consent obtained:  Verbal   Consent given by:  Patient   Risks discussed:  Allergic reaction, dysrhythmia, inadequate sedation, nausea, prolonged hypoxia resulting in organ damage, prolonged sedation necessitating reversal, respiratory compromise necessitating ventilatory assistance and intubation and vomiting   Alternatives discussed:  Analgesia without sedation, anxiolysis and regional anesthesia Universal protocol:    Procedure explained and questions answered to patient or proxy's satisfaction: yes     Relevant documents present and verified: yes     Test results available and properly labeled: yes     Imaging studies available: yes     Required blood products, implants, devices, and special equipment available: yes     Site/side marked: yes     Immediately prior to procedure a time out was called: yes     Patient identity confirmation method:  Verbally with patient Indications:    Procedure performed:  Dislocation reduction   Procedure necessitating sedation performed by:  Physician performing sedation Pre-sedation assessment:    Time since last food or drink:  2 hours   ASA classification: class 1 - normal, healthy patient     Neck mobility: normal     Mouth opening:  3 or more finger widths   Thyromental distance:  4 finger widths   Mallampati score:  I - soft palate, uvula, fauces, pillars visible   Pre-sedation assessments completed and reviewed: airway patency, cardiovascular function, hydration status, mental status, nausea/vomiting, pain level, respiratory function and temperature   Immediate pre-procedure details:    Reassessment: Patient reassessed immediately prior to procedure     Reviewed: vital  signs, relevant labs/tests and NPO status     Verified: bag valve mask available, emergency equipment available, intubation equipment available, IV patency confirmed, oxygen available and suction available   Procedure details (see MAR for exact dosages):    Preoxygenation:  Nasal cannula   Sedation:  Propofol and etomidate   Intra-procedure monitoring:  Blood pressure monitoring, cardiac monitor, continuous pulse oximetry, frequent LOC assessments, frequent vital sign checks and continuous capnometry   Intra-procedure events: none     Total Provider sedation time (minutes):  25 Post-procedure details:    Attendance: Constant attendance by certified staff until patient recovered     Recovery: Patient returned to pre-procedure baseline     Post-sedation assessments completed and reviewed: airway patency, cardiovascular function, hydration status, mental status, nausea/vomiting, pain level, respiratory function and temperature     Patient is stable for discharge or admission: yes     Patient tolerance:  Tolerated well, no immediate complications .Ortho Injury Treatment Date/Time: 11/23/2017 6:46 AM Performed by: Marily Memos, MD Authorized by: Marily Memos, MD   Consent:    Consent obtained:  Verbal   Consent given by:  Patient   Risks discussed:  Fracture, irreducible dislocation, recurrent dislocation and nerve damage   Alternatives discussed:  No treatmentInjury location: shoulder Location details: left shoulder Injury type: fracture-dislocation Dislocation type: anterior Fracture type: greater humeral  tuberosity Pre-procedure neurovascular assessment: neurovascularly intact Pre-procedure distal perfusion: normal Pre-procedure neurological function: normal Pre-procedure range of motion: normal  Anesthesia: Local anesthesia used: no  Patient sedated: Yes. Refer to sedation procedure documentation for details of sedation. Manipulation performed: yes Skin traction used:  no Skeletal traction used: yes Reduction successful: no Post-procedure neurovascular assessment: post-procedure neurovascularly intact Post-procedure distal perfusion: normal Post-procedure neurological function: normal Post-procedure range of motion: normal Patient tolerance: Patient tolerated the procedure well with no immediate complications Comments: Patient sedated with propofol and subsequently with etomidate with good results but not able to reduce the dislocation. PA also attempted per his procedure note and not successful.       ____________________________________________   INITIAL IMPRESSION / ASSESSMENT AND PLAN / ED COURSE  Unable to reduce dislocation. Discussed with Dr. Carola FrostHandy who will manage.      Pertinent labs & imaging results that were available during my care of the patient were reviewed by me and considered in my medical decision making (see chart for details).  ____________________________________________  FINAL CLINICAL IMPRESSION(S) / ED DIAGNOSES  Final diagnoses:  Dislocation of left shoulder joint, initial encounter  Other closed displaced fracture of proximal end of left humerus, initial encounter     MEDICATIONS GIVEN DURING THIS VISIT:  Medications  propofol (DIPRIVAN) 10 mg/mL bolus/IV push 52.2 mg (has no administration in time range)  propofol (DIPRIVAN) 10 mg/mL bolus/IV push (has no administration in time range)  etomidate (AMIDATE) 2 MG/ML injection (has no administration in time range)  HYDROmorphone (DILAUDID) injection 1 mg (1 mg Intravenous Given 11/23/17 0406)  propofol (DIPRIVAN) 10 mg/mL bolus/IV push (55 mg Intravenous Given 11/23/17 0509)  etomidate (AMIDATE) injection (10 mg Intravenous Given 11/23/17 0515)  HYDROmorphone (DILAUDID) injection 1 mg (1 mg Intravenous Given 11/23/17 0535)     NEW OUTPATIENT MEDICATIONS STARTED DURING THIS VISIT:  New Prescriptions   No medications on file    Note:  This note was  prepared with assistance of Dragon voice recognition software. Occasional wrong-word or sound-a-like substitutions may have occurred due to the inherent limitations of voice recognition software.   Marily MemosMesner, Roby Donaway, MD 11/23/17 [phone]

## 2017-11-23 NOTE — H&P (Signed)
[facility] Consultation  Reason for Consult: Left shoulder fracture dislocation Referring Physician: Merrily Pew, MD  Briana Allen is an 57 y.o. female.  HPI: Patient presented to ED several hours ago with severe left shoulder pain, deformity, and decreased motion after a fall. EDPs unable to close reduce with two separate attempts. She now c/o decreased sensation and motor function in her hand. Denies previous dislocation. X-rays demonstrate an anterior dislocation with impaction fracture of the greater tuberosity and head.  Past Medical History:  Diagnosis Date  . Asthma   . Chronic back pain   . COPD (chronic obstructive pulmonary disease) ([place])     Past Surgical History:  Procedure Laterality Date  . ABDOMINAL HYSTERECTOMY      No family history on file.  Social History:  reports that she has been smoking cigarettes. She has never used smokeless tobacco. She reports that she does not drink alcohol or use drugs.  Allergies:  Allergies  Allergen Reactions  . Ampicillin     Medications:  Prior to Admission:  Medications Prior to Admission  Medication Sig Dispense Refill Last Dose  . albuterol (PROVENTIL HFA;VENTOLIN HFA) 108 (90 Base) MCG/ACT inhaler Inhale into the lungs every 6 (six) hours as needed for wheezing or shortness of breath.   prn at prn  . baclofen (LIORESAL) 10 MG tablet Take 10 mg by mouth 3 (three) times daily as needed for muscle spasms.   07/15/2016 at 0600  . diazepam (VALIUM) 5 MG tablet Take 1 tablet (5 mg total) by mouth every 8 (eight) hours as needed for muscle spasms. 6 tablet 0 prn at prn  . diphenhydrAMINE (BENADRYL) 25 mg capsule Take 25 mg by mouth every 6 (six) hours as needed.   prn at prn  . ipratropium (ATROVENT HFA) 17 MCG/ACT inhaler Inhale 2 puffs into the lungs every 6 (six) hours.   prn at prn  . levocetirizine (XYZAL) 5 MG tablet Take 5 mg by mouth every evening.   prn at prn  . meloxicam (MOBIC) 15 MG tablet Take 1  tablet (15 mg total) by mouth daily. (Patient not taking: Reported on 07/16/2016) 30 tablet 0 Not Taking at Unknown time  . omeprazole (PRILOSEC) 20 MG capsule Take 20 mg by mouth daily.   07/15/2016 at 0600  . oxyCODONE-acetaminophen (ROXICET) 5-325 MG tablet Take 1 tablet by mouth every 6 (six) hours as needed for severe pain. (Patient not taking: Reported on 07/16/2016) 10 tablet 0 Not Taking at Unknown time  . prednisoLONE 5 MG TABS tablet Take 10 mg by mouth daily.   07/15/2016 at 0600  . predniSONE (DELTASONE) 50 MG tablet 1 tab PO daily x5 days 5 tablet 0   . QUEtiapine (SEROQUEL) 400 MG tablet Take 400 mg by mouth at bedtime.   07/15/2016 at 2000  . traMADol (ULTRAM) 50 MG tablet Take by mouth every 6 (six) hours as needed.   prn at prn  . traZODone (DESYREL) 150 MG tablet Take by mouth at bedtime.   07/15/2016 at 2000    Results for orders placed or performed during the hospital encounter of 11/23/17 (from the past 48 hour(s))  CBC with Differential     Status: Abnormal   Collection Time: 11/23/17  5:36 AM  Result Value Ref Range   WBC 14.1 (H) 4.0 - 10.5 K/uL   RBC 4.63 3.87 - 5.11 MIL/uL   Hemoglobin 13.8 12.0 - 15.0 g/dL   HCT 44.6 36.0 - 46.0 %  MCV 96.3 80.0 - 100.0 fL   MCH 29.8 26.0 - 34.0 pg   MCHC 30.9 30.0 - 36.0 g/dL   RDW 13.8 11.5 - 15.5 %   Platelets 200 150 - 400 K/uL   nRBC 0.0 0.0 - 0.2 %   Neutrophils Relative % 75 %   Neutro Abs 10.8 (H) 1.7 - 7.7 K/uL   Lymphocytes Relative 17 %   Lymphs Abs 2.4 0.7 - 4.0 K/uL   Monocytes Relative 6 %   Monocytes Absolute 0.8 0.1 - 1.0 K/uL   Eosinophils Relative 0 %   Eosinophils Absolute 0.0 0.0 - 0.5 K/uL   Basophils Relative 1 %   Basophils Absolute 0.1 0.0 - 0.1 K/uL   Immature Granulocytes 1 %   Abs Immature Granulocytes 0.07 0.00 - 0.07 K/uL    Comment: Performed at [facility] [address] [place], [place] [address]  Comprehensive metabolic panel     Status: Abnormal   Collection Time: 11/23/17  5:36 AM   Result Value Ref Range   Sodium 139 135 - 145 mmol/L   Potassium 3.6 3.5 - 5.1 mmol/L   Chloride 108 98 - 111 mmol/L   CO2 23 22 - 32 mmol/L   Glucose, Bld 129 (H) 70 - 99 mg/dL   BUN 12 6 - 20 mg/dL   Creatinine, Ser 0.75 0.44 - 1.00 mg/dL   Calcium 8.6 (L) 8.9 - 10.3 mg/dL   Total Protein 6.2 (L) 6.5 - 8.1 g/dL   Albumin 3.9 3.5 - 5.0 g/dL   AST 23 15 - 41 U/L   ALT 19 0 - 44 U/L   Alkaline Phosphatase 59 38 - 126 U/L   Total Bilirubin 0.4 0.3 - 1.2 mg/dL   GFR calc non Af Amer >60 >60 mL/min   GFR calc Af Amer >60 >60 mL/min    Comment: (NOTE) The eGFR has been calculated using the CKD EPI equation. This calculation has not been validated in all clinical situations. eGFR's persistently <60 mL/min signify possible Chronic Kidney Disease.    Anion gap 8 5 - 15    Comment: Performed at [facility] [address], [place], [place] [address]    Dg Shoulder Left  Result Date: 11/23/2017 CLINICAL DATA:  Fall EXAM: LEFT SHOULDER - 2+ VIEW COMPARISON:  None. FINDINGS: There is anterior dislocation at the left glenohumeral joint with a mildly displaced fracture of the posterolateral humeral head. Acromioclavicular joint is approximated. No clavicle fracture visualized. IMPRESSION: Anterior dislocation of the left glenohumeral joint with mildly displaced fracture of the posterolateral humeral head. Electronically Signed   By: Ulyses Jarred M.D.   On: 11/23/2017 04:32    ROS No recent fever, bleeding abnormalities, urologic dysfunction, GI problems, or weight gain.  Blood pressure (!) 150/126, pulse 83, resp. rate 18, height _0  (1.651 m), weight 104.3 kg, SpO2 96 %. Physical Exam NCAT, animated in manner LUEx   Sling in place  Sens  Ax, M intact; reports decreased in R/U  Mot   M intact; unable to elicit Ax/ R/ U  Rad 2+, Brisk CR   Assessment/Plan: Left should fracture dislocation with associated neurologic deficit  I discussed with the patient the risks and  benefits of closed reduction of her left shoulder under anesthesia, including the possible need to convert to open reduction with repair, infection, nerve injury, vessel injury, wound breakdown, arthritis, symptomatic hardware, DVT/ PE, loss of motion, malunion, nonunion, and need for further  surgery among others.  She acknowledged these risks and wished to proceed. We discussed the probability of nerve function return.  Altamese [place], MD [facility], [facility] [phone]  11/23/2017  7:25 AM

## 2017-11-23 NOTE — Progress Notes (Signed)
D/c instructions done with Lawanna KobusAngel who is the pt's sister. Explained all medication prescriptions given and what was given here at hospital. Explained to sister and pt what should and should not be taken and highlighted on d/c instructions when she could take medications next. Pt wanted to keep the prescriptions on her and not give them to sister.

## 2017-11-23 NOTE — Anesthesia Postprocedure Evaluation (Signed)
Anesthesia Post Note  Patient: Briana BrakemanDonna Jean Allen  Procedure(s) Performed: CLOSED REDUCTION SHOULDER (Left Arm Upper)     Patient location during evaluation: PACU Anesthesia Type: General Level of consciousness: sedated and patient cooperative Pain management: pain level controlled Vital Signs Assessment: post-procedure vital signs reviewed and stable Respiratory status: spontaneous breathing Cardiovascular status: stable Anesthetic complications: no    Last Vitals:  Vitals:   11/23/17 0853 11/23/17 0925  BP: (!) 134/96 (!) 116/99  Pulse: 86 86  Resp: 12 20  Temp:  36.8 C  SpO2: 94% 100%    Last Pain:  Vitals:   11/23/17 0853  PainSc: 5                  Lewie LoronJohn Coston Mandato

## 2017-11-24 ENCOUNTER — Encounter (HOSPITAL_COMMUNITY): Payer: Self-pay | Admitting: Orthopedic Surgery

## 2017-11-24 NOTE — Op Note (Signed)
NAMElouise Allen: Allen, Briana Allen MEDICAL RECORD [id number] ACCOUNT [id number] DATE OF BIRTH:1960/11/26 FACILITY: [facility] LOCATION: [facility] PHYSICIAN:Jehiel Koepp H. Kayda Allers, MD  OPERATIVE REPORT  DATE OF PROCEDURE:  11/23/2017  PREOPERATIVE DIAGNOSES:  Left shoulder fracture dislocation.  POSTOPERATIVE DIAGNOSES:  Left shoulder fracture dislocation.  PROCEDURE: 1.  Closed reduction of left shoulder dislocation. 2.  Closed treatment with manipulation of greater tuberosity fracture.  SURGEON:  Myrene GalasMichael Kennie Snedden, MD  ASSISTANT:  None.  ANESTHESIA:  General.  COMPLICATIONS:  None.  ESTIMATED BLOOD LOSS:  None.  DISPOSITION:  To PACU.  CONDITION:  Stable.  BRIEF SUMMARY AND INDICATIONS FOR PROCEDURE: The patient is a 57 year old female on several psychiatric medications who sustained a left shoulder dislocation with associated impaction fracture of the humeral head and greater tuberosity and a ground-level  fall.  Emergency Department physicians attempted closed reduction twice but were unsuccessful.  Consequently, we were consulted for further management.  I did discuss with the patient the risks and benefits of closed reduction including the potential  for conversion to open.  At the time of evaluation which was several hours after presentation, she also complained of decreased sensation and motor function in her hand involving the radial and ulnar nerve distributions specifically.  She did provide  consent to proceed.  BRIEF SUMMARY OF PROCEDURE:  The patient was taken to the operating room where general anesthesia was induced.  Her left shoulder was then reduced using counter pressure on the axilla and direct traction followed by rotation.  This restored her range of  motion.  C-arm was brought in, and a series of AP with internal and external rotation were obtained as well as an axillary.  Under fluoroscopy, I did manipulate the glenohumeral joint and did not identify any significant movement  of the fracture away from the humeral head to a position that would reduce her range or function.  Consequently, a decision was made as a result of this manipulation to continue with nonsurgical management.  The patient was placed into a sling, awakened from anesthesia and transported to PACU in  stable condition.  PROGNOSIS:  The patient will be in a sling but can begin immediate gentle motion, particularly flexion and extension.  We will continue to follow her neurologic function.  I plan to see her back in the office in 10-14 days for repeat evaluation.  We also contemplated a CT scan prior to discharge; however, her mental agitation precluded this without sedation.  Given our evaluation under fluoroscopy, we did not wish to subject the patient to further risk with sedation for the CT. She is at increased risk  of complications given what we perceive to be and reduced ability to comply with restrictions.  LN/NUANCE  D:11/23/2017 T:11/24/2017 JOB:[id number]

## 2017-12-12 ENCOUNTER — Other Ambulatory Visit: Payer: Self-pay | Admitting: Orthopedic Surgery

## 2017-12-12 DIAGNOSIS — S42255A Nondisplaced fracture of greater tuberosity of left humerus, initial encounter for closed fracture: Secondary | ICD-10-CM

## 2017-12-12 DIAGNOSIS — S43005A Unspecified dislocation of left shoulder joint, initial encounter: Secondary | ICD-10-CM

## 2017-12-22 ENCOUNTER — Ambulatory Visit
Admission: RE | Admit: 2017-12-22 | Discharge: 2017-12-22 | Disposition: A | Discharge status: Home / Self Care | Payer: Medicaid Other | Source: Ambulatory Visit | Attending: Orthopedic Surgery | Admitting: Orthopedic Surgery

## 2017-12-22 DIAGNOSIS — S43005A Unspecified dislocation of left shoulder joint, initial encounter: Secondary | ICD-10-CM | POA: Diagnosis not present

## 2017-12-22 DIAGNOSIS — S42255A Nondisplaced fracture of greater tuberosity of left humerus, initial encounter for closed fracture: Secondary | ICD-10-CM | POA: Insufficient documentation

## 2018-04-13 ENCOUNTER — Other Ambulatory Visit: Payer: Self-pay | Admitting: Orthopedic Surgery

## 2018-04-13 DIAGNOSIS — S43005D Unspecified dislocation of left shoulder joint, subsequent encounter: Secondary | ICD-10-CM

## 2018-04-13 DIAGNOSIS — M19012 Primary osteoarthritis, left shoulder: Secondary | ICD-10-CM

## 2018-04-13 DIAGNOSIS — M7502 Adhesive capsulitis of left shoulder: Secondary | ICD-10-CM

## 2018-04-13 DIAGNOSIS — S42255D Nondisplaced fracture of greater tuberosity of left humerus, subsequent encounter for fracture with routine healing: Secondary | ICD-10-CM

## 2018-05-25 ENCOUNTER — Other Ambulatory Visit: Payer: Self-pay | Admitting: Orthopedic Surgery

## 2018-05-25 DIAGNOSIS — S43005D Unspecified dislocation of left shoulder joint, subsequent encounter: Secondary | ICD-10-CM

## 2018-05-25 DIAGNOSIS — M7502 Adhesive capsulitis of left shoulder: Secondary | ICD-10-CM

## 2018-05-25 DIAGNOSIS — M19012 Primary osteoarthritis, left shoulder: Secondary | ICD-10-CM

## 2018-05-25 DIAGNOSIS — S42255D Nondisplaced fracture of greater tuberosity of left humerus, subsequent encounter for fracture with routine healing: Secondary | ICD-10-CM

## 2018-06-05 ENCOUNTER — Ambulatory Visit: Payer: Medicaid Other

## 2018-06-26 ENCOUNTER — Ambulatory Visit: Admission: RE | Admit: 2018-06-26 | Payer: Medicaid Other | Source: Ambulatory Visit

## 2019-08-16 ENCOUNTER — Ambulatory Visit: Payer: Medicaid Other | Admitting: Cardiology

## 2019-08-17 ENCOUNTER — Encounter: Payer: Self-pay | Admitting: Cardiology

## 2021-04-06 ENCOUNTER — Encounter (HOSPITAL_COMMUNITY): Payer: Self-pay

## 2021-04-06 ENCOUNTER — Emergency Department (HOSPITAL_COMMUNITY): Payer: No Typology Code available for payment source

## 2021-04-06 ENCOUNTER — Emergency Department (HOSPITAL_COMMUNITY)
Admission: EM | Admit: 2021-04-06 | Discharge: 2021-04-06 | Disposition: A | Discharge status: Home / Self Care | Payer: No Typology Code available for payment source | Attending: Emergency Medicine | Admitting: Emergency Medicine

## 2021-04-06 ENCOUNTER — Other Ambulatory Visit: Payer: Self-pay

## 2021-04-06 DIAGNOSIS — R519 Headache, unspecified: Secondary | ICD-10-CM | POA: Diagnosis not present

## 2021-04-06 DIAGNOSIS — S161XXA Strain of muscle, fascia and tendon at neck level, initial encounter: Secondary | ICD-10-CM | POA: Diagnosis not present

## 2021-04-06 DIAGNOSIS — Y92410 Unspecified street and highway as the place of occurrence of the external cause: Secondary | ICD-10-CM | POA: Diagnosis not present

## 2021-04-06 DIAGNOSIS — S8012XA Contusion of left lower leg, initial encounter: Secondary | ICD-10-CM | POA: Diagnosis not present

## 2021-04-06 DIAGNOSIS — M47892 Other spondylosis, cervical region: Secondary | ICD-10-CM | POA: Diagnosis not present

## 2021-04-06 DIAGNOSIS — S8992XA Unspecified injury of left lower leg, initial encounter: Secondary | ICD-10-CM | POA: Diagnosis present

## 2021-04-06 DIAGNOSIS — M47812 Spondylosis without myelopathy or radiculopathy, cervical region: Secondary | ICD-10-CM

## 2021-04-06 MED ORDER — CARISOPRODOL 250 MG PO TABS: 350.0000 mg | ORAL_TABLET | Freq: Three times a day (TID) | ORAL | 0 refills | Status: AC | PRN

## 2021-04-06 MED ORDER — HYDROCODONE-ACETAMINOPHEN 5-325 MG PO TABS
1.0000 | ORAL_TABLET | Freq: Once | ORAL | Status: AC
  Administered 2021-04-06: 1 via ORAL
  Filled 2021-04-06: qty 1

## 2021-04-06 NOTE — ED Notes (Signed)
Got patient on the monitor into a gown patient is resting with call bell in reach  
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
## 2021-04-06 NOTE — ED Notes (Signed)
Pt teaching provided on medications that may cause drowsiness. Pt instructed not to drive or operate heavy machinery while taking the prescribed medication. Pt verbalized understanding.  ? ?Pt provided discharge instructions and prescription information. Pt was given the opportunity to ask questions and questions were answered. Discharge signature not obtained in the setting of the COVID-19 pandemic in order to reduce high touch surfaces.  ? ?

## 2021-04-21 ENCOUNTER — Other Ambulatory Visit: Payer: Self-pay | Admitting: Chiropractor

## 2021-04-21 ENCOUNTER — Ambulatory Visit
Admission: RE | Admit: 2021-04-21 | Discharge: 2021-04-21 | Disposition: A | Discharge status: Home / Self Care | Payer: No Typology Code available for payment source | Source: Ambulatory Visit | Attending: Chiropractor | Admitting: Chiropractor

## 2021-04-21 ENCOUNTER — Inpatient Hospital Stay: Admit: 2021-04-21 | Payer: Medicaid Other

## 2021-04-21 DIAGNOSIS — S233XXA Sprain of ligaments of thoracic spine, initial encounter: Secondary | ICD-10-CM

## 2021-04-21 DIAGNOSIS — S134XXA Sprain of ligaments of cervical spine, initial encounter: Secondary | ICD-10-CM | POA: Diagnosis present

## 2021-04-21 DIAGNOSIS — S338XXA Sprain of other parts of lumbar spine and pelvis, initial encounter: Secondary | ICD-10-CM | POA: Diagnosis present

## 2023-01-19 ENCOUNTER — Emergency Department: Payer: MEDICAID

## 2023-01-19 ENCOUNTER — Emergency Department
Admission: EM | Admit: 2023-01-19 | Discharge: 2023-01-19 | Disposition: A | Discharge status: Home / Self Care | Payer: MEDICAID | Attending: Emergency Medicine | Admitting: Emergency Medicine

## 2023-01-19 ENCOUNTER — Other Ambulatory Visit: Payer: Self-pay

## 2023-01-19 ENCOUNTER — Encounter: Payer: Self-pay | Admitting: Emergency Medicine

## 2023-01-19 DIAGNOSIS — J45909 Unspecified asthma, uncomplicated: Secondary | ICD-10-CM | POA: Insufficient documentation

## 2023-01-19 DIAGNOSIS — J441 Chronic obstructive pulmonary disease with (acute) exacerbation: Secondary | ICD-10-CM | POA: Insufficient documentation

## 2023-01-19 DIAGNOSIS — R0781 Pleurodynia: Secondary | ICD-10-CM

## 2023-01-19 DIAGNOSIS — Z20822 Contact with and (suspected) exposure to covid-19: Secondary | ICD-10-CM | POA: Diagnosis not present

## 2023-01-19 DIAGNOSIS — R079 Chest pain, unspecified: Secondary | ICD-10-CM | POA: Diagnosis present

## 2023-01-19 LAB — BASIC METABOLIC PANEL
Anion gap: 10 (ref 5–15)
BUN: 14 mg/dL (ref 8–23)
CO2: 26 mmol/L (ref 22–32)
Calcium: 8.5 mg/dL — ABNORMAL LOW (ref 8.9–10.3)
Chloride: 104 mmol/L (ref 98–111)
Creatinine, Ser: 0.75 mg/dL (ref 0.44–1.00)
GFR, Estimated: >60 mL/min (ref >60)
Glucose, Bld: 103 mg/dL — ABNORMAL HIGH (ref 70–99)
Potassium: 3.2 mmol/L — ABNORMAL LOW (ref 3.5–5.1)
Sodium: 140 mmol/L (ref 135–145)

## 2023-01-19 LAB — CBC
HCT: 41.5 % (ref 36.0–46.0)
Hemoglobin: 13.8 g/dL (ref 12.0–15.0)
MCH: 30.2 pg (ref 26.0–34.0)
MCHC: 33.3 g/dL (ref 30.0–36.0)
MCV: 90.8 fL (ref 80.0–100.0)
Platelets: 253 10*3/uL (ref 150–400)
RBC: 4.57 MIL/uL (ref 3.87–5.11)
RDW: 13.5 % (ref 11.5–15.5)
WBC: 8.9 10*3/uL (ref 4.0–10.5)
nRBC: 0 % (ref 0.0–0.2)

## 2023-01-19 LAB — RESP PANEL BY RT-PCR (RSV, FLU A&B, COVID)  RVPGX2
Influenza A by PCR: NEGATIVE
Influenza B by PCR: NEGATIVE
Resp Syncytial Virus by PCR: NEGATIVE
SARS Coronavirus 2 by RT PCR: NEGATIVE

## 2023-01-19 LAB — D-DIMER, QUANTITATIVE: D-Dimer, Quant: 0.54 ug{FEU}/mL — ABNORMAL HIGH (ref 0.00–0.50)

## 2023-01-19 LAB — TROPONIN I (HIGH SENSITIVITY)
Troponin I (High Sensitivity): 8 ng/L (ref <18)
Troponin I (High Sensitivity): 6 ng/L (ref <18)

## 2023-01-19 MED ORDER — PREDNISONE 50 MG PO TABS: 50.0000 mg | ORAL_TABLET | Freq: Every day | ORAL | 0 refills | Status: AC

## 2023-01-19 MED ORDER — KETOROLAC TROMETHAMINE 30 MG/ML IJ SOLN
30.0000 mg | Freq: Once | INTRAMUSCULAR | Status: AC
  Administered 2023-01-19: 30 mg via INTRAVENOUS
  Filled 2023-01-19: qty 1

## 2023-01-19 MED ORDER — METHYLPREDNISOLONE SODIUM SUCC 125 MG IJ SOLR
125.0000 mg | Freq: Once | INTRAMUSCULAR | Status: AC
Start: 2023-01-19 — End: 2023-01-19
  Administered 2023-01-19: 125 mg via INTRAVENOUS
  Filled 2023-01-19: qty 2

## 2023-01-19 MED ORDER — POTASSIUM CHLORIDE CRYS ER 20 MEQ PO TBCR
40.0000 meq | EXTENDED_RELEASE_TABLET | Freq: Once | ORAL | Status: AC
  Administered 2023-01-19: 40 meq via ORAL
  Filled 2023-01-19: qty 2

## 2023-01-19 MED ORDER — IPRATROPIUM-ALBUTEROL 0.5-2.5 (3) MG/3ML IN SOLN
3.0000 mL | RESPIRATORY_TRACT | Status: AC
  Administered 2023-01-19: 3 mL via RESPIRATORY_TRACT
  Filled 2023-01-19: qty 3

## 2023-01-19 NOTE — ED Provider Notes (Signed)
 [facility] Provider Note    Event Date/Time   First MD Initiated Contact with Patient 01/19/23 [phone]     (approximate)   History   Chest Pain   HPI  Briana Allen is a 63 y.o. female with history of COPD, asthma who presents to the emergency department with left-sided chest pain.  Describes it as worse with deep inspiration, palpation and coughing.  She has had a productive cough but no fever.  Reports she is feeling short of breath and is having wheezing.  No history of PE, DVT, lower extremity swelling or discomfort.   History provided by patient.    Past Medical History:  Diagnosis Date   Asthma    Chronic back pain    COPD (chronic obstructive pulmonary disease) (HCC)     Past Surgical History:  Procedure Laterality Date   ABDOMINAL HYSTERECTOMY     SHOULDER CLOSED REDUCTION Left 11/23/2017   Procedure: CLOSED REDUCTION SHOULDER;  Surgeon: Celena Sharper, MD;  Location: [facility] OR;  Service: Orthopedics;  Laterality: Left;    MEDICATIONS:  Prior to Admission medications   Medication Sig Start Date End Date Taking? Authorizing Provider  albuterol  (PROVENTIL  HFA;VENTOLIN  HFA) 108 (90 Base) MCG/ACT inhaler Inhale into the lungs every 6 (six) hours as needed for wheezing or shortness of breath.    [provider]  baclofen (LIORESAL) 10 MG tablet Take 10 mg by mouth 3 (three) times daily as needed for muscle spasms.    [provider]  carisoprodol  (SOMA ) 250 MG tablet Take 1.5 tablets (375 mg total) by mouth every 8 (eight) hours as needed for muscle spasms. 04/06/21   Lorriane Holmes, MD  diazepam  (VALIUM ) 5 MG tablet Take 1 tablet (5 mg total) by mouth every 8 (eight) hours as needed for muscle spasms. 05/29/15   Viviann Pastor, MD  diphenhydrAMINE (BENADRYL) 25 mg capsule Take 25 mg by mouth every 6 (six) hours as needed.    [provider]  HYDROcodone -acetaminophen  (NORCO) 5-325 MG tablet Take 1 tablet by mouth  every 8 (eight) hours as needed for moderate pain. 11/23/17   Deward Eck, PA-C  ipratropium (ATROVENT HFA) 17 MCG/ACT inhaler Inhale 2 puffs into the lungs every 6 (six) hours.    [provider]  ketorolac  (TORADOL ) 10 MG tablet Take 1 tablet (10 mg total) by mouth every 6 (six) hours as needed for moderate pain. 11/23/17   Deward Eck, PA-C  levocetirizine (XYZAL) 5 MG tablet Take 5 mg by mouth every evening.    [provider]  omeprazole (PRILOSEC) 20 MG capsule Take 20 mg by mouth daily.    [provider]  ondansetron  (ZOFRAN  ODT) 4 MG disintegrating tablet Take 1 tablet (4 mg total) by mouth every 8 (eight) hours as needed for nausea or vomiting. 11/23/17   Deward Eck, PA-C  prednisoLONE 5 MG TABS tablet Take 10 mg by mouth daily.    [provider]  QUEtiapine (SEROQUEL) 400 MG tablet Take 400 mg by mouth at bedtime.    [provider]  traMADol (ULTRAM) 50 MG tablet Take by mouth every 6 (six) hours as needed.    [provider]  traZODone (DESYREL) 150 MG tablet Take by mouth at bedtime.    [provider]    Physical Exam   Triage Vital Signs: ED Triage Vitals  Encounter Vitals Group     BP 01/19/23 0149 (!) 132/98     Systolic BP Percentile --  Diastolic BP Percentile --      Pulse Rate 01/19/23 0149 77     Resp 01/19/23 0149 20     Temp 01/19/23 0149 97.9 F (36.6 C)     Temp Source 01/19/23 0149 Oral     SpO2 01/19/23 0149 96 %     Weight 01/19/23 0145 230 lb (104.3 kg)     Height 01/19/23 0145 5' 6 (1.676 m)     Head Circumference --      Peak Flow --      Pain Score 01/19/23 0144 8     Pain Loc --      Pain Education --      Exclude from Growth Chart --     Most recent vital signs: Vitals:   01/19/23 0149 01/19/23 0400  BP: (!) 132/98 (!) 116/97  Pulse: 77 72  Resp: 20   Temp: 97.9 F (36.6 C) 97.6 F (36.4 C)  SpO2: 96% 96%    CONSTITUTIONAL: Alert, responds appropriately to  questions.  Chronically ill-appearing HEAD: Normocephalic, atraumatic EYES: Conjunctivae clear, pupils appear equal, sclera nonicteric ENT: normal nose; moist mucous membranes NECK: Supple, normal ROM CARD: RRR; S1 and S2 appreciated, chest wall is tender to palpation without crepitus or deformity RESP: Patient is splinting.  No tachypnea.  Diffuse inspiratory and expiratory wheezes on exam.  No rhonchi or rales.  No hypoxia.  Speaking full sentences.  No respiratory distress. ABD/GI: Non-distended; soft, non-tender, no rebound, no guarding, no peritoneal signs BACK: The back appears normal EXT: Normal ROM in all joints; no deformity noted, no edema, no calf tenderness or calf swelling SKIN: Normal color for age and race; warm; no rash on exposed skin NEURO: Moves all extremities equally, normal speech PSYCH: The patient's mood and manner are appropriate.   ED Results / Procedures / Treatments   LABS: (all labs ordered are listed, but only abnormal results are displayed) Labs Reviewed  BASIC METABOLIC PANEL - Abnormal; Notable for the following components:      Result Value   Potassium 3.2 (*)    Glucose, Bld 103 (*)    Calcium 8.5 (*)    All other components within normal limits  RESP PANEL BY RT-PCR (RSV, FLU A&B, COVID)  RVPGX2  CBC  D-DIMER, QUANTITATIVE  TROPONIN I (HIGH SENSITIVITY)  TROPONIN I (HIGH SENSITIVITY)     EKG:  EKG Interpretation Date/Time:  Sunday January 19 2023 01:47:39 EST Ventricular Rate:  77 PR Interval:  152 QRS Duration:  84 QT Interval:  386 QTC Calculation: 436 R Axis:   69  Text Interpretation: Normal sinus rhythm Normal ECG When compared with ECG of 26-Feb-2017 21:29, PREVIOUS ECG IS PRESENT Confirmed by Neomi Neptune [phone]) on 01/19/2023 6:31:49 AM         RADIOLOGY: My personal review and interpretation of imaging: Chest x-ray clear.  I have personally reviewed all radiology reports.   DG Chest 2 View Result Date:  01/19/2023 CLINICAL DATA:  Chest pain. EXAM: CHEST - 2 VIEW COMPARISON:  February 26, 2017 FINDINGS: The heart size and mediastinal contours are within normal limits. Very mild atelectatic changes are seen within the bilateral lung bases. No pleural effusion or pneumothorax is identified. The visualized skeletal structures are unremarkable. IMPRESSION: Very mild bibasilar atelectasis. Electronically Signed   By: Suzen Dials M.D.   On: 01/19/2023 02:32     PROCEDURES:  Critical Care performed: No     Procedures    IMPRESSION / MDM / ASSESSMENT  AND PLAN / ED COURSE  I reviewed the triage vital signs and the nursing notes.    Patient here with left-sided chest pain, splinting, wheezing.     DIFFERENTIAL DIAGNOSIS (includes but not limited to):   COPD exacerbation, pneumonia, PE, MSK pain, less likely ACS   Patient's presentation is most consistent with acute presentation with potential threat to life or bodily function.   PLAN: Workup initiated from triage.  No leukocytosis.  Troponin x 2 negative.  COVID, flu and RSV negative.  Chest x-ray reviewed and interpreted by myself and the radiologist and is clear.  She has splinting and appears uncomfortable on my exam.  Pain may be musculoskeletal in nature but will obtain D-dimer to rule out PE.  Will give breathing treatments, steroids, Toradol .   MEDICATIONS GIVEN IN ED: Medications  ipratropium-albuterol  (DUONEB) 0.5-2.5 (3) MG/3ML nebulizer solution 3 mL (has no administration in time range)  methylPREDNISolone  sodium succinate (SOLU-MEDROL ) 125 mg/2 mL injection 125 mg (has no administration in time range)  ketorolac  (TORADOL ) 30 MG/ML injection 30 mg (has no administration in time range)  potassium chloride  SA (KLOR-CON  M) CR tablet 40 mEq (has no administration in time range)     ED COURSE: Patient will be signed out to oncoming ED provider at 7 AM to follow-up on D-dimer and reassess after IV medications, nebulizer  treatments.   CONSULTS: Pending further workup.   OUTSIDE RECORDS REVIEWED: Reviewed last orthopedic note in September 2020.       FINAL CLINICAL IMPRESSION(S) / ED DIAGNOSES   Final diagnoses:  COPD with acute exacerbation (HCC)  Pleuritic chest pain     Rx / DC Orders   ED Discharge Orders     None        Note:  This document was prepared using Dragon voice recognition software and may include unintentional dictation errors.   Donnovan Stamour, Josette SAILOR, DO 01/19/23 [phone]

## 2023-01-19 NOTE — ED Provider Notes (Signed)
 Patient received in signout from Dr. Neomi.  COPD with dimer pending.  Age-adjusted dimer is negative.  Patient reports feeling much better after breathing treatments.  I reassessed her and she has some mild persistent wheezing but she reports she wants to go home, her ride is on the way she does not want to stay for another treatment.  Reports that she can take them at home.  We discussed management at home and close ED return precautions.  She is appreciative.   Claudene Rover, MD 01/19/23 [phone]

## 2023-01-19 NOTE — ED Triage Notes (Signed)
 Pt arrived via POV with reports of chest pain 2 days ago off and on, worse tonight,  pt unable to sit still, pt also reports a "bad cold"  that started 4-5 days ago. Denies any fevers.
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
## 2023-09-27 ENCOUNTER — Emergency Department: Payer: MEDICAID

## 2023-09-27 ENCOUNTER — Emergency Department
Admission: EM | Admit: 2023-09-27 | Discharge: 2023-09-27 | Disposition: A | Discharge status: Home / Self Care | Payer: MEDICAID | Attending: Emergency Medicine | Admitting: Emergency Medicine

## 2023-09-27 ENCOUNTER — Other Ambulatory Visit: Payer: Self-pay

## 2023-09-27 DIAGNOSIS — M25511 Pain in right shoulder: Secondary | ICD-10-CM | POA: Insufficient documentation

## 2023-09-27 DIAGNOSIS — M25562 Pain in left knee: Secondary | ICD-10-CM | POA: Insufficient documentation

## 2023-09-27 DIAGNOSIS — W19XXXA Unspecified fall, initial encounter: Secondary | ICD-10-CM

## 2023-09-27 DIAGNOSIS — W010XXA Fall on same level from slipping, tripping and stumbling without subsequent striking against object, initial encounter: Secondary | ICD-10-CM | POA: Insufficient documentation

## 2023-09-27 MED ORDER — ACETAMINOPHEN 325 MG PO TABS
650.0000 mg | ORAL_TABLET | Freq: Once | ORAL | Status: AC
  Administered 2023-09-27: 650 mg via ORAL
  Filled 2023-09-27: qty 2

## 2023-09-27 NOTE — ED Triage Notes (Signed)
 Pt to ED for mechanical fall after slipping 2 weeks ago. Pt has healing abrasion to L knee that looks like it had skin tears. Complains of L knee pain and R shoulder pain and cannot fully lift her R arm.  Pt has steady gait.

## 2023-09-27 NOTE — Discharge Instructions (Signed)
 Your x-rays were negative.  Please follow-up with Dr. Cleotilde with orthopedics.  Please return for any new, worsening, or change in symptoms or other concerns.  It was a pleasure caring for you today.

## 2023-09-27 NOTE — ED Provider Notes (Signed)
 [facility] Provider Note    Event Date/Time   First MD Initiated Contact with Patient 09/27/23 1028     (approximate)   History   Shoulder Pain and Knee Pain   HPI  Briana Allen is a 63 y.o. female who presents today for evaluation of right shoulder pain.  Patient reports that she had a fall 2 weeks ago when she slipped and fell onto her right shoulder.  She reports that she scraped her left knee in the process, but does not have much pain at that location now.  She reports that she continues to have pain in her right shoulder, particularly with movement.  She reports that she did not see any provider until today.  She denies radiation of pain.  There was no head strike or LOC at the time, she has not had any headache or neck pain.  No paresthesias or weakness in her arm.  She is not anticoagulated.  Patient has a friend at the bedside.  There are no active problems to display for this patient.     There are no active problems to display for this patient.        Physical Exam   Triage Vital Signs: ED Triage Vitals  Encounter Vitals Group     BP 09/27/23 0947 132/83     Girls Systolic BP Percentile --      Girls Diastolic BP Percentile --      Boys Systolic BP Percentile --      Boys Diastolic BP Percentile --      Pulse Rate 09/27/23 0947 67     Resp 09/27/23 0947 18     Temp 09/27/23 0947 (!) 97.5 F (36.4 C)     Temp Source 09/27/23 0947 Oral     SpO2 09/27/23 0947 99 %     Weight 09/27/23 0947 221 lb (100.2 kg)     Height 09/27/23 0947 5' 6 (1.676 m)     Head Circumference --      Peak Flow --      Pain Score 09/27/23 0952 10     Pain Loc --      Pain Education --      Exclude from Growth Chart --     Most recent vital signs: Vitals:   09/27/23 0947  BP: 132/83  Pulse: 67  Resp: 18  Temp: (!) 97.5 F (36.4 C)  SpO2: 99%    Physical Exam Vitals and nursing note reviewed.  Constitutional:      General: Awake and  alert. No acute distress.    Appearance: Normal appearance. The patient is normal weight.  HENT:     Head: Normocephalic and atraumatic.     Mouth: Mucous membranes are moist.  Eyes:     General: PERRL. Normal EOMs        Right eye: No discharge.        Left eye: No discharge.     Conjunctiva/sclera: Conjunctivae normal.  Cardiovascular:     Rate and Rhythm: Normal rate and regular rhythm.     Pulses: Normal pulses.  Pulmonary:     Effort: Pulmonary effort is normal. No respiratory distress.     Breath sounds: Normal breath sounds.  Abdominal:     Abdomen is soft. There is no abdominal tenderness. No rebound or guarding. No distention. Musculoskeletal:        General: No swelling. Normal range of motion.     Cervical back: Normal  range of motion and neck supple.  Negative Spurling and Lhermitte sign, normal grip strength bilaterally, normal intrinsic muscle function of the hands bilaterally. Right shoulder: No obvious deformity, swelling, ecchymosis, or erythema No clavicular or AC joint tenderness Able to actively and passively forward flex and abduct to 90 degrees and then has pain, negative drop arm test Pain with attempted Obriens, SLAP, empty can, and lift off tests Normal internal and external rotation against resistance Pain with attempted Hawkins and Neers Normal ROM at elbow and wrist Normal resisted pronation and supination 2+ radial pulse Normal grip strength Normal intrinsic hand muscle function Left knee: Healed appearing skin abrasion without erythema. No joint line tenderness. No patellar tenderness, no ballotment Warm and well perfused extremity with 2+ pedal pulses 5/5 strength to dorsiflexion and plantarflexion at the ankle with intact sensation throughout extremity Normal range of motion of the knee, with intact flexion and extension to active and passive range of motion. Extensor mechanism intact. No ligamentous laxity. Negative anterior/posterior  drawer/negative lachman, negative mcmurrays No effusion or warmth Intact quadriceps, hamstring function, patellar tendon function Pelvis stable Full ROM of ankle without pain or swelling Foot warm and well perfused Skin:    General: Skin is warm and dry.     Capillary Refill: Capillary refill takes less than 2 seconds.     Findings: No rash.  Neurological:     Mental Status: The patient is awake and alert.  No focal neurological deficits.     ED Results / Procedures / Treatments   Labs (all labs ordered are listed, but only abnormal results are displayed) Labs Reviewed - No data to display   EKG     RADIOLOGY I independently reviewed and interpreted imaging and agree with radiologists findings.     PROCEDURES:  Critical Care performed:   Procedures   MEDICATIONS ORDERED IN ED: Medications  acetaminophen  (TYLENOL ) tablet 650 mg (650 mg Oral Given 09/27/23 1108)     IMPRESSION / MDM / ASSESSMENT AND PLAN / ED COURSE  I reviewed the triage vital signs and the nursing notes.   Differential diagnosis includes, but is not limited to, fracture, contusion, rotator cuff injury, strain.  Patient is awake and alert, hemodynamically stable and afebrile.  She is neurovascularly and neurologically intact.  She has full normal range of motion of her left knee, with old appearing abrasions that appear to be well-healed.  There is no swelling or erythema or acute wounds noted.  Patient has decreased range of motion of her shoulder secondary to pain.  She is unable to abduct or forward flex past 90 degrees without significant discomfort.  However she has negative drop arm test, do not suspect complete supraspinatus tear.  X-ray of her shoulder and knee obtained in triage are negative for any acute injuries.  I suspect a possible rotator cuff injury.  I recommended outpatient follow-up with orthopedics, and the appropriate follow-up information was provided.  She was instructed to  call on Monday.  She was given Tylenol  in the emergency department with good effect.  There is no head strike or LOC at the time of the incident, she has no neck pain or cervical spine tenderness or radicular symptoms in her arm to suggest acute cervical spine injury.  No vomiting or focal neurological deficits to suggest intracranial injury.  Injury was 2 weeks ago, no indication for CT head or neck today.  We discussed return precautions and outpatient follow-up.  Patient understands and agrees with plan.  She was discharged in stable condition.   Patient's presentation is most consistent with acute complicated illness / injury requiring diagnostic workup.    FINAL CLINICAL IMPRESSION(S) / ED DIAGNOSES   Final diagnoses:  Acute pain of left knee  Acute pain of right shoulder  Fall, initial encounter     Rx / DC Orders   ED Discharge Orders     None        Note:  This document was prepared using Dragon voice recognition software and may include unintentional dictation errors.   Briahna Pescador E, PA-C 09/27/23 1201    Arlander Charleston, MD 09/27/23 [phone]
